# Patient Record
Sex: Female | Born: 1985 | Race: Black or African American | Hispanic: No | Marital: Single | Smoking: Current every day smoker
Health system: Southern US, Community
[De-identification: ages and names within clinical notes are randomized; demographics above are authoritative.]

## PROBLEM LIST (undated history)

## (undated) DIAGNOSIS — J45909 Unspecified asthma, uncomplicated: Secondary | ICD-10-CM

---

## 2013-04-10 ENCOUNTER — Emergency Department: Payer: Self-pay | Admitting: Emergency Medicine

## 2013-08-13 ENCOUNTER — Emergency Department: Payer: Self-pay | Admitting: Emergency Medicine

## 2013-11-15 ENCOUNTER — Emergency Department: Payer: Self-pay | Admitting: Emergency Medicine

## 2013-11-17 ENCOUNTER — Emergency Department: Payer: Self-pay | Admitting: Emergency Medicine

## 2014-01-04 ENCOUNTER — Emergency Department: Payer: Self-pay | Admitting: Emergency Medicine

## 2014-01-06 LAB — BETA STREP CULTURE(ARMC)

## 2014-09-15 ENCOUNTER — Encounter: Payer: Self-pay | Admitting: *Deleted

## 2014-09-15 ENCOUNTER — Emergency Department
Admission: EM | Admit: 2014-09-15 | Discharge: 2014-09-15 | Disposition: A | Discharge status: Home / Self Care | Payer: Self-pay | Attending: Student | Admitting: Student

## 2014-09-15 DIAGNOSIS — R1084 Generalized abdominal pain: Secondary | ICD-10-CM

## 2014-09-15 DIAGNOSIS — N946 Dysmenorrhea, unspecified: Secondary | ICD-10-CM | POA: Insufficient documentation

## 2014-09-15 DIAGNOSIS — Z720 Tobacco use: Secondary | ICD-10-CM | POA: Insufficient documentation

## 2014-09-15 HISTORY — DX: Unspecified asthma, uncomplicated: J45.909

## 2014-09-15 LAB — CBC WITH DIFFERENTIAL/PLATELET
BASOS ABS: 0.1 10*3/uL (ref 0–0.1)
Basophils Relative: 1 %
Eosinophils Absolute: 0.6 10*3/uL (ref 0–0.7)
Eosinophils Relative: 6 %
HEMATOCRIT: 39 % (ref 35.0–47.0)
HEMOGLOBIN: 12.1 g/dL (ref 12.0–16.0)
Lymphocytes Relative: 27 %
Lymphs Abs: 2.9 10*3/uL (ref 1.0–3.6)
MCH: 22.7 pg — ABNORMAL LOW (ref 26.0–34.0)
MCHC: 31.1 g/dL — AB (ref 32.0–36.0)
MCV: 72.8 fL — AB (ref 80.0–100.0)
Monocytes Absolute: 0.5 10*3/uL (ref 0.2–0.9)
Monocytes Relative: 5 %
Neutro Abs: 6.7 10*3/uL — ABNORMAL HIGH (ref 1.4–6.5)
Neutrophils Relative %: 61 %
Platelets: 264 10*3/uL (ref 150–440)
RBC: 5.35 MIL/uL — ABNORMAL HIGH (ref 3.80–5.20)
RDW: 14.4 % (ref 11.5–14.5)
WBC: 10.8 10*3/uL (ref 3.6–11.0)

## 2014-09-15 LAB — URINALYSIS COMPLETE WITH MICROSCOPIC (ARMC ONLY)
Bilirubin Urine: NEGATIVE
Glucose, UA: NEGATIVE mg/dL
Leukocytes, UA: NEGATIVE
NITRITE: NEGATIVE
PH: 7 (ref 5.0–8.0)
Protein, ur: 30 mg/dL — AB
Specific Gravity, Urine: 1.026 (ref 1.005–1.030)

## 2014-09-15 LAB — COMPREHENSIVE METABOLIC PANEL
ALK PHOS: 32 U/L — AB (ref 38–126)
ALT: 16 U/L (ref 14–54)
AST: 27 U/L (ref 15–41)
Albumin: 5.1 g/dL — ABNORMAL HIGH (ref 3.5–5.0)
Anion gap: 9 (ref 5–15)
BILIRUBIN TOTAL: 1 mg/dL (ref 0.3–1.2)
BUN: 13 mg/dL (ref 6–20)
CHLORIDE: 102 mmol/L (ref 101–111)
CO2: 24 mmol/L (ref 22–32)
Calcium: 9.6 mg/dL (ref 8.9–10.3)
Creatinine, Ser: 0.61 mg/dL (ref 0.44–1.00)
GFR calc Af Amer: >60 mL/min (ref >60)
GFR calc non Af Amer: >60 mL/min (ref >60)
Glucose, Bld: 81 mg/dL (ref 65–99)
POTASSIUM: 3.6 mmol/L (ref 3.5–5.1)
Sodium: 135 mmol/L (ref 135–145)
Total Protein: 8.7 g/dL — ABNORMAL HIGH (ref 6.5–8.1)

## 2014-09-15 MED ORDER — IBUPROFEN 800 MG PO TABS
ORAL_TABLET | ORAL | Status: AC
  Filled 2014-09-15: qty 1

## 2014-09-15 MED ORDER — OXYCODONE-ACETAMINOPHEN 5-325 MG PO TABS
1.0000 | ORAL_TABLET | Freq: Once | ORAL | Status: AC
  Administered 2014-09-15: 1 via ORAL

## 2014-09-15 MED ORDER — TRAMADOL HCL 50 MG PO TABS
50.0000 mg | ORAL_TABLET | Freq: Four times a day (QID) | ORAL | Status: DC | PRN
Start: 2014-09-15 — End: 2015-03-27

## 2014-09-15 MED ORDER — OXYCODONE-ACETAMINOPHEN 5-325 MG PO TABS
ORAL_TABLET | ORAL | Status: AC
  Filled 2014-09-15: qty 1

## 2014-09-15 MED ORDER — IBUPROFEN 800 MG PO TABS
800.0000 mg | ORAL_TABLET | Freq: Once | ORAL | Status: AC
  Administered 2014-09-15: 800 mg via ORAL

## 2014-09-15 MED ORDER — IBUPROFEN 800 MG PO TABS: 800.0000 mg | ORAL_TABLET | Freq: Three times a day (TID) | ORAL | Status: DC | PRN

## 2014-09-15 NOTE — ED Notes (Signed)
Patient with complaint of right lower abd and lower back pain that started today around 15:00.

## 2014-09-15 NOTE — Discharge Instructions (Signed)

## 2014-09-15 NOTE — ED Notes (Signed)
Pt to triage via wheelchair.  Menses began today and now pt is having cramps.  States aleve isn't helping.  Pt has low back pain

## 2014-09-15 NOTE — ED Provider Notes (Signed)
[facility] Emergency Department Provider Note ____________________________________________  Time seen: ----------------------------------------- 8:29 PM on 09/15/2014 -----------------------------------------    I have reviewed the triage vital signs and the nursing notes.   HISTORY  Chief Complaint Abdominal Cramping    HPI Tina Levy is a 29 y.o. female who presents with one day of abd pain with her menses, but "it is a lot worse than usual and not relieved with aleve".  No f/c, n/v.  She says her pain is all over  But worse on the right side.  No sore throat, cough.  She has mild head congestion and sinus pressure.  No dysuria or urinary frequency.  She is sexually active and denies other vaginal discharge other than menses.  Past Medical History  Diagnosis Date  . Asthma     There are no active problems to display for this patient.   History reviewed. No pertinent past surgical history.  Current Outpatient Rx  Name  Route  Sig  Dispense  Refill  . traMADol (ULTRAM) 50 MG tablet   Oral   Take 1 tablet (50 mg total) by mouth every 6 (six) hours as needed.   8 tablet   0     Allergies Peanuts  No family history on file.  Social History History  Substance Use Topics  . Smoking status: Current Every Day Smoker  . Smokeless tobacco: Not on file  . Alcohol Use: No    Review of Systems  Constitutional: Negative for fever. Eyes: Negative for visual changes. ENT: Negative for sore throat. Cardiovascular: Negative for chest pain. Respiratory: Negative for shortness of breath. Gastrointestinal: Negative for abdominal pain, vomiting and diarrhea. Genitourinary: Negative for dysuria. Musculoskeletal: Negative for back pain. Skin: Negative for rash. Neurological: Negative for headaches, focal weakness or numbness.   10-point ROS otherwise negative.  ____________________________________________   PHYSICAL EXAM:  VITAL  SIGNS: ED Triage Vitals  Enc Vitals Group     BP 09/15/14 1933 112/74 mmHg     Pulse Rate 09/15/14 1933 80     Resp --      Temp 09/15/14 1933 98.3 F (36.8 C)     Temp Source 09/15/14 1933 Oral     SpO2 09/15/14 1933 99 %     Weight 09/15/14 1933 95 lb (43.092 kg)     Height 09/15/14 1933 5\' 2"  (1.575 m)     Head Cir --      Peak Flow --      Pain Score 09/15/14 1935 10     Pain Loc --      Pain Edu? --      Excl. in GC? --     Constitutional: Alert and oriented. Well appearing and in no distress. Eyes: Conjunctivae are normal. PERRL. Normal extraocular movements. Cardiovascular: Normal rate, regular rhythm. Normal and symmetric distal pulses are present in all extremities. No murmurs, rubs, or gallops. Respiratory: Normal respiratory effort without tachypnea nor retractions. Breath sounds are clear and equal bilaterally. No wheezes/rales/rhonchi. Gastrointestinal: Soft and mild RUQ/RLQ/Epigastric tenderness w/o guarding or rebound.  Positive BS.Marland Kitchen. No distention. No abdominal bruits.  Genitourinary:  Musculoskeletal: Nontender with normal range of motion in all extremities. No joint effusions.  No lower extremity tenderness nor edema. Neurologic:  Normal speech and language. No gross focal neurologic deficits are appreciated. Speech is normal. No gait instability. Skin:  Skin is warm, dry and intact. No rash noted. Psychiatric: Mood and affect are normal. Speech and behavior are normal. Patient exhibits appropriate  insight and judgment.  ____________________________________________    LABS (pertinent positives/negatives)  I have reviewed all lab results which are normal or stable.     EKG    ____________________________________________    RADIOLOGY    ____________________________________________   PROCEDURES  Procedure(s) performed: None  Critical Care performed: No  ____________________________________________   INITIAL IMPRESSION / ASSESSMENT AND PLAN  / ED COURSE  Dysmenorrhea  Generalized abdominal pain  Labs and urine are WNL.  She was instructed to return to the ER for worsening pain, developing of nausea/vomiting, or fever/chills.   Pertinent labs & imaging results that were available during my care of the patient were reviewed by me and considered in my medical decision making (see chart for details).  ____________________________________________   FINAL CLINICAL IMPRESSION(S) / ED DIAGNOSES  Final diagnoses:  Generalized abdominal pain  Dysmenorrhea      Ignacia BayleyRobert Bora Bost, PA-C 09/15/14 2249  Gayla DossEryka A Gayle, MD 09/16/14 [phone]

## 2014-10-10 ENCOUNTER — Emergency Department
Admission: EM | Admit: 2014-10-10 | Discharge: 2014-10-10 | Disposition: A | Discharge status: Home / Self Care | Payer: Self-pay | Attending: Emergency Medicine | Admitting: Emergency Medicine

## 2014-10-10 ENCOUNTER — Encounter: Payer: Self-pay | Admitting: Emergency Medicine

## 2014-10-10 DIAGNOSIS — L0501 Pilonidal cyst with abscess: Secondary | ICD-10-CM | POA: Insufficient documentation

## 2014-10-10 DIAGNOSIS — L0591 Pilonidal cyst without abscess: Secondary | ICD-10-CM

## 2014-10-10 DIAGNOSIS — Z720 Tobacco use: Secondary | ICD-10-CM | POA: Insufficient documentation

## 2014-10-10 MED ORDER — SULFAMETHOXAZOLE-TRIMETHOPRIM 800-160 MG PO TABS
1.0000 | ORAL_TABLET | Freq: Two times a day (BID) | ORAL | Status: DC
Start: 2014-10-10 — End: 2017-02-25

## 2014-10-10 MED ORDER — HYDROCODONE-ACETAMINOPHEN 5-325 MG PO TABS: 1.0000 | ORAL_TABLET | ORAL | Status: DC | PRN

## 2014-10-10 MED ORDER — NAPROXEN 500 MG PO TABS: 500.0000 mg | ORAL_TABLET | Freq: Two times a day (BID) | ORAL | Status: DC

## 2014-10-10 NOTE — ED Notes (Signed)
Pt here with abscess to right butt cheek.  Pt states that she has had the area for 1 day. Pt denies any drainage, pt has hx of the same. Pt in NAD at this time and was ambulatory to treatment room.

## 2014-10-10 NOTE — Discharge Instructions (Signed)
Pilonidal Cyst A pilonidal cyst occurs when hairs get trapped (ingrown) beneath the skin in the crease between the buttocks over your sacrum (the bone under that crease). Pilonidal cysts are most common in young men with a lot of body hair. When the cyst is ruptured (breaks) or leaking, fluid from the cyst may cause burning and itching. If the cyst becomes infected, it causes a painful swelling filled with pus (abscess). The pus and trapped hairs need to be removed (often by lancing) so that the infection can heal. However, recurrence is common and an operation may be needed to remove the cyst. HOME CARE INSTRUCTIONS   If the cyst was NOT INFECTED:  Keep the area clean and dry. Bathe or shower daily. Wash the area well with a germ-killing soap. Warm tub baths may help prevent infection and help with drainage. Dry the area well with a towel.  Avoid tight clothing to keep area as moisture free as possible.  Keep area between buttocks as free of hair as possible. A depilatory may be used.  If the cyst WAS INFECTED and needed to be drained:  Your caregiver packed the wound with gauze to keep the wound open. This allows the wound to heal from the inside outwards and continue draining.  Return for a wound check in 1 day or as suggested.  If you take tub baths or showers, repack the wound with gauze following them. Sponge baths (at the sink) are a good alternative.  If an antibiotic was ordered to fight the infection, take as directed.  Only take over-the-counter or prescription medicines for pain, discomfort, or fever as directed by your caregiver.  After the drain is removed, use sitz baths for 20 minutes 4 times per day. Clean the wound gently with mild unscented soap, pat dry, and then apply a dry dressing. SEEK MEDICAL CARE IF:   You have increased pain, swelling, redness, drainage, or bleeding from the area.  You have a fever.  You have muscles aches, dizziness, or a general ill  feeling. Document Released: 04/26/2000 Document Revised: 07/22/2011 Document Reviewed: 06/24/2008 Exit[employer] Patient Information 2015 [employer]. This information is not intended to replace advice given to you by your health care provider. Make sure you discuss any questions you have with your health care provider.  

## 2014-10-10 NOTE — ED Provider Notes (Signed)
[facility] Emergency Department Provider Note  ____________________________________________  Time seen: Approximately 9:13 PM  I have reviewed the triage vital signs and the nursing notes.   HISTORY  Chief Complaint Abscess    HPI Tina Levy is a 29 y.o. female who presents to the emergency department for an abscess. Symptoms started today. She has had a pilonidal cyst drained approximately one year ago. The area became tender today. She denies fever, nausea, or vomiting.   Past Medical History  Diagnosis Date  . Asthma     There are no active problems to display for this patient.   History reviewed. No pertinent past surgical history.  Current Outpatient Rx  Name  Route  Sig  Dispense  Refill  . HYDROcodone-acetaminophen (NORCO/VICODIN) 5-325 MG per tablet   Oral   Take 1 tablet by mouth every 4 (four) hours as needed for moderate pain.   9 tablet   0   . ibuprofen (ADVIL,MOTRIN) 800 MG tablet   Oral   Take 1 tablet (800 mg total) by mouth every 8 (eight) hours as needed.   15 tablet   0   . naproxen (NAPROSYN) 500 MG tablet   Oral   Take 1 tablet (500 mg total) by mouth 2 (two) times daily with a meal.   60 tablet   2   . sulfamethoxazole-trimethoprim (BACTRIM DS,SEPTRA DS) 800-160 MG per tablet   Oral   Take 1 tablet by mouth 2 (two) times daily.   20 tablet   0   . traMADol (ULTRAM) 50 MG tablet   Oral   Take 1 tablet (50 mg total) by mouth every 6 (six) hours as needed.   8 tablet   0     Allergies Peanuts  No family history on file.  Social History History  Substance Use Topics  . Smoking status: Current Every Day Smoker    Types: Cigarettes  . Smokeless tobacco: Not on file  . Alcohol Use: No    Review of Systems Constitutional: No fever/chills Eyes: No visual changes. ENT: No sore throat. Cardiovascular: Denies chest pain. Respiratory: Denies shortness of breath. Gastrointestinal: No abdominal  pain.  No nausea, no vomiting.. Genitourinary: Negative for dysuria. Musculoskeletal: Negative for back pain. Skin: Negative for rash. Neurological: Negative for headaches, focal weakness or numbness.  10-point ROS otherwise negative.  ____________________________________________   PHYSICAL EXAM:  VITAL SIGNS: ED Triage Vitals  Enc Vitals Group     BP 10/10/14 1937 108/65 mmHg     Pulse Rate 10/10/14 1937 98     Resp 10/10/14 1937 16     Temp 10/10/14 1937 97.7 F (36.5 C)     Temp Source 10/10/14 1937 Oral     SpO2 10/10/14 1937 98 %     Weight 10/10/14 1937 105 lb (47.628 kg)     Height 10/10/14 1937  (1.6 m)     Head Cir --      Peak Flow --      Pain Score 10/10/14 1937 10     Pain Loc --      Pain Edu? --      Excl. in GC? --     Constitutional: Alert and oriented. Well appearing and in no acute distress. Eyes: Conjunctivae are normal. PERRL. EOMI. Head: Atraumatic. Nose: No congestion/rhinnorhea. Mouth/Throat: Mucous membranes are moist.   Neck: No stridor.   Cardiovascular:   Good peripheral circulation. Respiratory: Normal respiratory effort.   Gastrointestinal: Soft and nontender.  No distention. Musculoskeletal: No lower extremity tenderness nor edema.  No joint effusions. Neurologic:  Normal speech and language. No gross focal neurologic deficits are appreciated. Speech is normal. No gait instability. Skin:  Skin is warm, dry and intact. No rash noted. Tenderness present on right side of the coccyx. Area is not erythematous and is not draining. Psychiatric: Mood and affect are normal. Speech and behavior are normal.  ____________________________________________   LABS (all labs ordered are listed, but only abnormal results are displayed)  Labs Reviewed - No data to  display ____________________________________________  EKG   ____________________________________________  RADIOLOGY   ____________________________________________   PROCEDURES  Procedure(s) performed: None  Critical Care performed: No  ____________________________________________   INITIAL IMPRESSION / ASSESSMENT AND PLAN / ED COURSE  Pertinent labs & imaging results that were available during my care of the patient were reviewed by me and considered in my medical decision making (see chart for details).  Patient was advised to follow-up with a surgeon. The cyst was not opened and drained today because there is no indication of infection other than tenderness in the area. She will be prescribed Bactrim and pain medication. She was advised to return to the emergency department for symptoms that change or worsen if she is unable schedule an appointment with a surgeon. ____________________________________________   FINAL CLINICAL IMPRESSION(S) / ED DIAGNOSES  Final diagnoses:  Pilonidal cyst      Chinita PesterCari B Tuwanna Krausz, FNP 10/10/14 2119  Minna AntisKevin Paduchowski, MD 10/10/14 2329

## 2014-10-13 ENCOUNTER — Telehealth: Payer: Self-pay | Admitting: Emergency Medicine

## 2014-10-13 NOTE — ED Notes (Signed)
Pt called due to lost her instructions with surgeon name on it.  i gave pt dr Juliann Pulselundquist number.

## 2014-10-18 ENCOUNTER — Ambulatory Visit: Payer: Self-pay | Admitting: Surgery

## 2015-03-27 ENCOUNTER — Emergency Department
Admission: EM | Admit: 2015-03-27 | Discharge: 2015-03-27 | Disposition: A | Discharge status: Home / Self Care | Payer: BLUE CROSS/BLUE SHIELD | Attending: Emergency Medicine | Admitting: Emergency Medicine

## 2015-03-27 ENCOUNTER — Encounter: Payer: Self-pay | Admitting: Urgent Care

## 2015-03-27 DIAGNOSIS — Z792 Long term (current) use of antibiotics: Secondary | ICD-10-CM | POA: Diagnosis not present

## 2015-03-27 DIAGNOSIS — F17210 Nicotine dependence, cigarettes, uncomplicated: Secondary | ICD-10-CM | POA: Diagnosis not present

## 2015-03-27 DIAGNOSIS — M779 Enthesopathy, unspecified: Secondary | ICD-10-CM | POA: Insufficient documentation

## 2015-03-27 DIAGNOSIS — M79601 Pain in right arm: Secondary | ICD-10-CM | POA: Diagnosis present

## 2015-03-27 MED ORDER — MELOXICAM 7.5 MG PO TABS: 7.5000 mg | ORAL_TABLET | Freq: Every day | ORAL | Status: AC

## 2015-03-27 MED ORDER — TRAMADOL HCL 50 MG PO TABS: 50.0000 mg | ORAL_TABLET | Freq: Four times a day (QID) | ORAL | Status: DC | PRN

## 2015-03-27 NOTE — ED Notes (Signed)
Patient presents with pain that radiates from her RIGHT hand to her shoulder and neck that started yesterday. Denies injury. Able to move extremity with minimal difficulty.

## 2015-03-27 NOTE — Discharge Instructions (Signed)
Tendinitis and Tenosynovitis  °Tendinitis is inflammation of the tendon. Tenosynovitis is inflammation of the lining around the tendon (tendon sheath). These painful conditions often occur at once. Tendons attach muscle to bone. To move a limb, force from the muscle moves through the tendon, to the bone. These conditions often cause increased pain when moving. Tendinitis may be caused by a small or partial tear in the tendon.  °SYMPTOMS  °· Pain, tenderness, redness, bruising, or swelling at the injury. °· Loss of normal joint movement. °· Pain that gets worse with use of the muscle and joint attached to the tendon. °· Weakness in the tendon, caused by calcium build up that may occur with tendinitis. °· Commonly affected tendons: °¨ Achilles tendon (calf of leg). °¨ Rotator cuff (shoulder joint). °¨ Patellar tendon (kneecap to shin). °¨ Peroneal tendon (ankle). °¨ Posterior tibial tendon (inner ankle). °¨ Biceps tendon (in front of shoulder). °CAUSES  °· Sudden strain on a flexed muscle, muscle overuse, sudden increase or change in activity, vigorous activity. °· Result of a direct hit (less common). °· Poor muscle action (biomechanics). °RISK INCREASES WITH: °· Injury (trauma). °· Too much exercise. °· Sudden change in athletic activity. °· Incorrect exercise form or technique. °· Poor strength and flexibility. °· Not warming-up properly before activity. °· Returning to activity before healing is complete. °PREVENTION  °· Warm-up and stretch properly before activity. °· Maintain physical fitness: °¨ Joint flexibility. °¨ Muscle strength and endurance. °¨ Fitness that increases heart rate. °· Learn and use proper exercise techniques. °· Use rehabilitation exercises to strengthen weak muscles and tendons. °· Ice the tendon after activity, to reduce recurring inflammation. °· Wear proper fitting protective equipment for specific tendons, when indicated. °PROGNOSIS  °When treated properly, can be cured in 6 to 8 weeks.  Recovery may take longer, depending on degree of injury.  °RELATED COMPLICATIONS  °· Re-injury or recurring symptoms. °· Permanent weakness or joint stiffness, if injury is severe and recovery is not completed. °· Delayed healing, if sports are started before healing is complete. °· Tearing apart (rupture) of the inflamed tendon. Tendinitis means the tendon is injured and must recover. °TREATMENT  °Treatment first involves ice, medicine, and rest from aggravating activities. This reduces pain and inflammation. Modifying your activity may be considered to prevent recurring injury. A brace, elastic bandage wrap, splint, cast, or sling may be prescribed to protect the joint for a short period. After that period, strengthening and stretching exercise may help to regain strength and full range of motion. If the condition persists, despite non-surgical treatment, surgery may be recommended to remove the inflamed tendon lining. Corticosteroid injections may be given to reduce inflammation. However, these injections may weaken the tendon and increase your risk for tendon rupture. °MEDICATION  °· If pain medicine is needed, nonsteroidal anti-inflammatory medicines (aspirin and ibuprofen), or other minor pain relievers (acetaminophen), are often recommended. °· Do not take pain medicine for 7 days before surgery. °· Prescription pain relievers are usually prescribed only after surgery. Use only as directed and only as much as you need. °· Ointments applied to the skin may be helpful. °· Corticosteroid injections may be given to reduce inflammation. However, this may increase your risk of a tendon rupture. °HEAT AND COLD °· Cold treatment (icing) relieves pain and reduces inflammation. Cold treatment should be applied for 10 to 15 minutes every 2 to 3 hours, and immediately after activity that aggravates your symptoms. Use ice packs or an ice massage. °· Heat   treatment may be used before performing stretching and strengthening  activities prescribed by your caregiver, physical therapist, or athletic trainer. Use a heat pack or a warm water soak. °SEEK MEDICAL CARE IF:  °· Symptoms get worse or do not improve, despite treatment. °· Pain becomes too much to tolerate. °· You develop numbness or tingling. °· Toes become cold, or toenails become blue, gray, or dark colored. °· New, unexplained symptoms develop. (Drugs used in treatment may produce side effects.) °  °This information is not intended to replace advice given to you by your health care provider. Make sure you discuss any questions you have with your health care provider. °  °Document Released: 04/29/2005 Document Revised: 07/22/2011 Document Reviewed: 08/11/2008 °Elsevier Interactive Patient Education ©2016 [employer] ° °

## 2015-03-27 NOTE — ED Provider Notes (Signed)
[facility] Emergency Department Provider Note ____________________________________________  Time seen: Approximately 11:13 PM  I have reviewed the triage vital signs and the nursing notes.   HISTORY  Chief Complaint Arm Pain   HPI Tina Levy is a 29 y.o. female who presents to the emergency department for evaluation of right arm pain that started while at work today. 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 She denies previous similar symptoms. She denies injury. She has taken ibuprofen without relief.    Past Medical History  Diagnosis Date  . Asthma     There are no active problems to display for this patient.   History reviewed. No pertinent past surgical history.  Current Outpatient Rx  Name  Route  Sig  Dispense  Refill  . HYDROcodone-acetaminophen (NORCO/VICODIN) 5-325 MG per tablet   Oral   Take 1 tablet by mouth every 4 (four) hours as needed for moderate pain.   9 tablet   0   . ibuprofen (ADVIL,MOTRIN) 800 MG tablet   Oral   Take 1 tablet (800 mg total) by mouth every 8 (eight) hours as needed.   15 tablet   0   . meloxicam (MOBIC) 7.5 MG tablet   Oral   Take 1 tablet (7.5 mg total) by mouth daily.   30 tablet   0   . sulfamethoxazole-trimethoprim (BACTRIM DS,SEPTRA DS) 800-160 MG per tablet   Oral   Take 1 tablet by mouth 2 (two) times daily.   20 tablet   0   . traMADol (ULTRAM) 50 MG tablet   Oral   Take 1 tablet (50 mg total) by mouth every 6 (six) hours as needed.   9 tablet   0     Allergies Peanuts  No family history on file.  Social History Social History  Substance Use Topics  . Smoking status: Current Every Day Smoker    Types: Cigarettes  . Smokeless tobacco: None  . Alcohol Use: Yes    Review of Systems Constitutional: No recent illness. Eyes: No visual changes. ENT: No sore throat. Cardiovascular: Denies chest pain or palpitations. Respiratory: Denies  shortness of breath. Gastrointestinal: No abdominal pain.  Genitourinary: Negative for dysuria. Musculoskeletal: Pain in right arm with radiation into anterior shoulder.  Skin: Negative for rash. Neurological: Negative for headaches, focal weakness or numbness. 10-point ROS otherwise negative.  ____________________________________________   PHYSICAL EXAM:  VITAL SIGNS: ED Triage Vitals  Enc Vitals Group     BP 03/27/15 2231 119/64 mmHg     Pulse Rate 03/27/15 2231 61     Resp 03/27/15 2231 16     Temp 03/27/15 2231 98.1 F (36.7 C)     Temp Source 03/27/15 2231 Oral     SpO2 03/27/15 2231 100 %     Weight 03/27/15 2231 96 lb (43.545 kg)     Height 03/27/15 2231  (1.575 m)     Head Cir --      Peak Flow --      Pain Score 03/27/15 2231 10     Pain Loc --      Pain Edu? --      Excl. in GC? --     Constitutional: Alert and oriented. Well appearing and in no acute distress. Eyes: Conjunctivae are normal. EOMI. Head: Atraumatic. Nose: No congestion/rhinnorhea. Neck: No stridor.  Respiratory: Normal respiratory effort.   Musculoskeletal: Limited extension and flexion of right upper extremity at the  elbow due to pain.  Neurologic:  Normal speech and language. No gross focal neurologic deficits are appreciated. Speech is normal. No gait instability. Skin:  Skin is warm, dry and intact. Atraumatic. Psychiatric: Mood and affect are normal. Speech and behavior are normal.  ____________________________________________   LABS (all labs ordered are listed, but only abnormal results are displayed)  Labs Reviewed - No data to display ____________________________________________  RADIOLOGY  Not indicated ____________________________________________   PROCEDURES  Procedure(s) performed:   Sling applied to RUE by ER tech.   ____________________________________________   INITIAL IMPRESSION / ASSESSMENT AND PLAN / ED COURSE  Pertinent labs & imaging results that  were available during my care of the patient were reviewed by me and considered in my medical decision making (see chart for details).  Patient was instructed to follow up with orthopedics for symptoms that are not improving over the next week. She was advised to return to the emergency department for symptoms that change or worsen if unable schedule an appointment. She will take meloxicam for pain. She was advised to stop ibuprofen or Naprosyn. ____________________________________________   FINAL CLINICAL IMPRESSION(S) / ED DIAGNOSES  Final diagnoses:  Tendonitis       Chinita PesterCari B Dru Primeau, FNP 03/28/15 0001  Jennye MoccasinBrian S Quigley, MD 03/28/15 [phone]

## 2015-05-27 ENCOUNTER — Emergency Department
Admission: EM | Admit: 2015-05-27 | Discharge: 2015-05-27 | Disposition: A | Discharge status: Home / Self Care | Payer: BLUE CROSS/BLUE SHIELD | Attending: Emergency Medicine | Admitting: Emergency Medicine

## 2015-05-27 ENCOUNTER — Emergency Department: Payer: BLUE CROSS/BLUE SHIELD

## 2015-05-27 DIAGNOSIS — F17210 Nicotine dependence, cigarettes, uncomplicated: Secondary | ICD-10-CM | POA: Insufficient documentation

## 2015-05-27 DIAGNOSIS — M25511 Pain in right shoulder: Secondary | ICD-10-CM | POA: Insufficient documentation

## 2015-05-27 DIAGNOSIS — Z79899 Other long term (current) drug therapy: Secondary | ICD-10-CM | POA: Diagnosis not present

## 2015-05-27 DIAGNOSIS — Z791 Long term (current) use of non-steroidal anti-inflammatories (NSAID): Secondary | ICD-10-CM | POA: Diagnosis not present

## 2015-05-27 MED ORDER — TRAMADOL HCL 50 MG PO TABS: 50.0000 mg | ORAL_TABLET | Freq: Four times a day (QID) | ORAL | Status: DC | PRN

## 2015-05-27 MED ORDER — IBUPROFEN 600 MG PO TABS
600.0000 mg | ORAL_TABLET | Freq: Once | ORAL | Status: AC
  Administered 2015-05-27: 600 mg via ORAL
  Filled 2015-05-27: qty 1

## 2015-05-27 NOTE — ED Notes (Signed)
Pt reports about 2 months ago she developed pain to her right shoulder and came to the ER. She was told pain was from repeatitive use of the arm. Pt was kept out of work 2 days and then she had to have a note stating ok to return to work so she went to her PCP and her PCP sent her to physical therapy. Pt still going to physical therapy but is back working. This week she is back working her full 8 hour shift.  Tonight while at work pt reports she noticed her arm felt like it was swelling. Pt states the pain is a little worse tonight. Pt denies injury.

## 2015-05-27 NOTE — ED Provider Notes (Signed)
[facility] Emergency Department Provider Note  ____________________________________________   I have reviewed the triage vital signs and the nursing notes.   HISTORY  Chief Complaint Shoulder Pain    HPI Marta Lamasatashia Market is a 30 y.o. female who presents today complaining of shoulder pain. She has had shoulder pain for 2 not months. She only recently went back to full-time work because of her shoulder pain and now her shoulder pain is back and worse again. It never completely went away. She has not had an x-ray. She has been sent to physical therapy by her primary care doctor. She states that there is no fever, no recollected trauma. She has not seen an orthopedic surgeon for this. She would like a note for reduced labor at her job.Patient does do multiple repetitive motions in her job. She states it hurts to lift her hand over her shoulder. No numbness no weakness no neck pain  Past Medical History  Diagnosis Date  . Asthma     There are no active problems to display for this patient.   No past surgical history on file.  Current Outpatient Rx  Name  Route  Sig  Dispense  Refill  . HYDROcodone-acetaminophen (NORCO/VICODIN) 5-325 MG per tablet   Oral   Take 1 tablet by mouth every 4 (four) hours as needed for moderate pain.   9 tablet   0   . ibuprofen (ADVIL,MOTRIN) 800 MG tablet   Oral   Take 1 tablet (800 mg total) by mouth every 8 (eight) hours as needed.   15 tablet   0   . meloxicam (MOBIC) 7.5 MG tablet   Oral   Take 1 tablet (7.5 mg total) by mouth daily.   30 tablet   0   . sulfamethoxazole-trimethoprim (BACTRIM DS,SEPTRA DS) 800-160 MG per tablet   Oral   Take 1 tablet by mouth 2 (two) times daily.   20 tablet   0   . traMADol (ULTRAM) 50 MG tablet   Oral   Take 1 tablet (50 mg total) by mouth every 6 (six) hours as needed.   9 tablet   0     Allergies Peanuts  No family history on file.  Social History Social History   Substance Use Topics  . Smoking status: Current Every Day Smoker    Types: Cigarettes  . Smokeless tobacco: Not on file  . Alcohol Use: Yes    Review of Systems Constitutional: No fever/chills Eyes: No visual changes. ENT: No sore throat. No stiff neck no neck pain Cardiovascular: Denies chest pain. Respiratory: Denies shortness of breath. Gastrointestinal:   no vomiting.  No diarrhea.  No constipation. Genitourinary: Negative for dysuria. Musculoskeletal: Negative lower extremity swelling Skin: Negative for rash. Neurological: Negative for headaches, focal weakness or numbness. 10-point ROS otherwise negative.  ____________________________________________   PHYSICAL EXAM:  VITAL SIGNS: ED Triage Vitals  Enc Vitals Group     BP 05/27/15 0412 137/91 mmHg     Pulse Rate 05/27/15 0412 85     Resp 05/27/15 0412 16     Temp 05/27/15 0412 97.8 F (36.6 C)     Temp Source 05/27/15 0412 Oral     SpO2 05/27/15 0412 100 %     Weight 05/27/15 0412 97 lb (43.999 kg)     Height 05/27/15 0412 5\' 2"  (1.575 m)     Head Cir --      Peak Flow --      Pain Score 05/27/15  0421 10     Pain Loc --      Pain Edu? --      Excl. in GC? --     Constitutional: Alert and oriented. Well appearing and in no acute distress.  Neck: No stridor.   Nontender with no meningismus Cardiovascular: Normal rate, regular rhythm. Grossly normal heart sounds.  Good peripheral circulation. Respiratory: Normal respiratory effort.  No retractions. Lungs CTAB. Abdominal: Soft and nontender. No distention. No guarding no rebound Back:  There is no focal tenderness or step off there is no midline tenderness there are no lesions noted. there is no CVA tenderness Musculoskeletal: No lower extremity tenderness. No joint effusions, no DVT signs strong distal pulses no edema, right shoulder is mildly tender to touch, she has noticed a palpation but no erythema is not hot to touch does not red. She has pain to range  of motion above the 90 mark. Strong distal pulses, compartments are soft sensation and strength are intact Neurologic:  Normal speech and language. No gross focal neurologic deficits are appreciated.  Skin:  Skin is warm, dry and intact. No rash noted. Psychiatric: Mood and affect are normal. Speech and behavior are normal.  ____________________________________________   LABS (all labs ordered are listed, but only abnormal results are displayed)  Labs Reviewed - No data to display ____________________________________________  EKG  I personally interpreted any EKGs ordered by me or triage  ____________________________________________  RADIOLOGY  I reviewed any imaging ordered by me or triage that were performed during my shift ____________________________________________   PROCEDURES  Procedure(s) performed: None  Critical Care performed: None  ____________________________________________   INITIAL IMPRESSION / ASSESSMENT AND PLAN / ED COURSE  Pertinent labs & imaging results that were available during my care of the patient were reviewed by me and considered in my medical decision making (see chart for details). Patient presents today with very reproducible shoulder pain or some by repetitive motion. She does not recall having a x-ray for this. We will obtain imaging although low suspicion for any acute factor. It does not appear to be infected, patient will need to follow up with orthopedic surgery and we will refer her in that direction. Patient is requesting pain medication we'll give her a prescription for pain meds although given courtesy of pain I will not give her long prescription, just enough that she can see her doctor. She is neurovascular intact with no obvious evidence of radiculopathy. ____________________________________________   FINAL CLINICAL IMPRESSION(S) / ED DIAGNOSES  Final diagnoses:  None     Jeanmarie Plant, MD 05/27/15 [phone]

## 2015-05-27 NOTE — ED Notes (Signed)
Pt with chronic shoulder pain reports worsening pain tonight. Pt is neuro intact. Pt is reluctant to move arm due to pain

## 2015-06-03 ENCOUNTER — Emergency Department
Admission: EM | Admit: 2015-06-03 | Discharge: 2015-06-03 | Disposition: A | Discharge status: Home / Self Care | Payer: BLUE CROSS/BLUE SHIELD | Attending: Emergency Medicine | Admitting: Emergency Medicine

## 2015-06-03 ENCOUNTER — Emergency Department: Payer: BLUE CROSS/BLUE SHIELD

## 2015-06-03 DIAGNOSIS — Z3202 Encounter for pregnancy test, result negative: Secondary | ICD-10-CM | POA: Insufficient documentation

## 2015-06-03 DIAGNOSIS — R55 Syncope and collapse: Secondary | ICD-10-CM | POA: Insufficient documentation

## 2015-06-03 DIAGNOSIS — Z791 Long term (current) use of non-steroidal anti-inflammatories (NSAID): Secondary | ICD-10-CM | POA: Insufficient documentation

## 2015-06-03 DIAGNOSIS — Z792 Long term (current) use of antibiotics: Secondary | ICD-10-CM | POA: Insufficient documentation

## 2015-06-03 DIAGNOSIS — F17210 Nicotine dependence, cigarettes, uncomplicated: Secondary | ICD-10-CM | POA: Diagnosis not present

## 2015-06-03 DIAGNOSIS — R251 Tremor, unspecified: Secondary | ICD-10-CM | POA: Insufficient documentation

## 2015-06-03 LAB — FIBRIN DERIVATIVES D-DIMER (ARMC ONLY): Fibrin derivatives D-dimer (ARMC): 322 (ref 0–499)

## 2015-06-03 LAB — BASIC METABOLIC PANEL
ANION GAP: 7 (ref 5–15)
BUN: 13 mg/dL (ref 6–20)
CHLORIDE: 109 mmol/L (ref 101–111)
CO2: 23 mmol/L (ref 22–32)
Calcium: 8.7 mg/dL — ABNORMAL LOW (ref 8.9–10.3)
Creatinine, Ser: 0.61 mg/dL (ref 0.44–1.00)
Glucose, Bld: 102 mg/dL — ABNORMAL HIGH (ref 65–99)
Potassium: 2.9 mmol/L — CL (ref 3.5–5.1)
SODIUM: 139 mmol/L (ref 135–145)

## 2015-06-03 LAB — CBC WITH DIFFERENTIAL/PLATELET
Basophils Absolute: 0 10*3/uL (ref 0–0.1)
Basophils Relative: 0 %
EOS ABS: 0.2 10*3/uL (ref 0–0.7)
Eosinophils Relative: 1 %
HEMATOCRIT: 32.5 % — AB (ref 35.0–47.0)
Hemoglobin: 10.5 g/dL — ABNORMAL LOW (ref 12.0–16.0)
Lymphocytes Relative: 11 %
Lymphs Abs: 1.5 10*3/uL (ref 1.0–3.6)
MCH: 22.9 pg — ABNORMAL LOW (ref 26.0–34.0)
MCHC: 32.4 g/dL (ref 32.0–36.0)
MCV: 70.9 fL — AB (ref 80.0–100.0)
MONOS PCT: 5 %
Monocytes Absolute: 0.8 10*3/uL (ref 0.2–0.9)
Neutro Abs: 11.8 10*3/uL — ABNORMAL HIGH (ref 1.4–6.5)
Neutrophils Relative %: 83 %
PLATELETS: 272 10*3/uL (ref 150–440)
RBC: 4.59 MIL/uL (ref 3.80–5.20)
RDW: 14.5 % (ref 11.5–14.5)
WBC: 14.3 10*3/uL — ABNORMAL HIGH (ref 3.6–11.0)

## 2015-06-03 LAB — BLOOD GAS, VENOUS
Acid-base deficit: 3.6 mmol/L — ABNORMAL HIGH (ref 0.0–2.0)
Bicarbonate: 20.4 mEq/L — ABNORMAL LOW (ref 21.0–28.0)
PATIENT TEMPERATURE: 37
pCO2, Ven: 33 mmHg — ABNORMAL LOW (ref 44.0–60.0)
pH, Ven: 7.4 (ref 7.320–7.430)
pO2, Ven: <31 mmHg (ref 30.0–45.0)

## 2015-06-03 LAB — URINALYSIS COMPLETE WITH MICROSCOPIC (ARMC ONLY)
Bilirubin Urine: NEGATIVE
Glucose, UA: NEGATIVE mg/dL
HGB URINE DIPSTICK: NEGATIVE
NITRITE: NEGATIVE
Protein, ur: NEGATIVE mg/dL
Specific Gravity, Urine: 1.002 — ABNORMAL LOW (ref 1.005–1.030)
pH: 5 (ref 5.0–8.0)

## 2015-06-03 LAB — POCT PREGNANCY, URINE: PREG TEST UR: NEGATIVE

## 2015-06-03 LAB — TSH: TSH: 2.637 u[IU]/mL (ref 0.350–4.500)

## 2015-06-03 MED ORDER — SODIUM CHLORIDE 0.9 % IV BOLUS (SEPSIS)
1000.0000 mL | Freq: Once | INTRAVENOUS | Status: AC
  Administered 2015-06-03: 1000 mL via INTRAVENOUS

## 2015-06-03 MED ORDER — POTASSIUM CHLORIDE CRYS ER 20 MEQ PO TBCR
40.0000 meq | EXTENDED_RELEASE_TABLET | Freq: Once | ORAL | Status: AC
  Administered 2015-06-03: 40 meq via ORAL
  Filled 2015-06-03: qty 2

## 2015-06-03 NOTE — ED Notes (Signed)
Pt c/o hot flashes, trembling and near syncope.  Pt reports she was told by her supervisor at work that she was having breathing difficulty/increased respirations - pt does not remember having breathing issues.  Pt denies N/V. Pt reports she received the pneumonia shot at 2pm on 1/20. Pt received 300 mL NaCl from EMS prior to arrival. Pt had temp of 100.7, trembles and lightheadedness upon EMS arrival. Pt currently trembling.

## 2015-06-03 NOTE — Discharge Instructions (Signed)
Please seek medical attention for any high fevers, chest pain, shortness of breath, change in behavior, persistent vomiting, bloody stool or any other new or concerning symptoms. ° ° °Near-Syncope °Near-syncope (commonly known as near fainting) is sudden weakness, dizziness, or feeling like you might pass out. During an episode of near-syncope, you may also develop pale skin, have tunnel vision, or feel sick to your stomach (nauseous). Near-syncope may occur when getting up after sitting or while standing for a long time. It is caused by a sudden decrease in blood flow to the brain. This decrease can result from various causes or triggers, most of which are not serious. However, because near-syncope can sometimes be a sign of something serious, a medical evaluation is required. The specific cause is often not determined. °HOME CARE INSTRUCTIONS  °Monitor your condition for any changes. The following actions may help to alleviate any discomfort you are experiencing: °· Have someone stay with you until you feel stable. °· Lie down right away and prop your feet up if you start feeling like you might faint. Breathe deeply and steadily. Wait until all the symptoms have passed. Most of these episodes last only a few minutes. You may feel tired for several hours.   °· Drink enough fluids to keep your urine clear or pale yellow.   °· If you are taking blood pressure or heart medicine, get up slowly when seated or lying down. Take several minutes to sit and then stand. This can reduce dizziness. °· Follow up with your health care provider as directed.  °SEEK IMMEDIATE MEDICAL CARE IF:  °· You have a severe headache.   °· You have unusual pain in the chest, abdomen, or back.   °· You are bleeding from the mouth or rectum, or you have black or tarry stool.   °· You have an irregular or very fast heartbeat.   °· You have repeated fainting or have seizure-like jerking during an episode.   °· You faint when sitting or lying down.    °· You have confusion.   °· You have difficulty walking.   °· You have severe weakness.   °· You have vision problems.   °MAKE SURE YOU:  °· Understand these instructions. °· Will watch your condition. °· Will get help right away if you are not doing well or get worse. °  °This information is not intended to replace advice given to you by your health care provider. Make sure you discuss any questions you have with your health care provider. °  °Document Released: 04/29/2005 Document Revised: 05/04/2013 Document Reviewed: 10/02/2012 °Elsevier Interactive Patient Education ©2016 [employer] ° °

## 2015-06-03 NOTE — ED Provider Notes (Signed)
[facility] Emergency Department Provider Note   ____________________________________________  Time seen: 20  I have reviewed the triage vital signs and the nursing notes.   HISTORY  Chief Complaint Near Syncope   History limited by: Not Limited   HPI Tina Levy is a 30 y.o. female who presents from work today after a near syncopal episode. The patient states that she had been feeling good for most of the day. She went to her doctor's office and got a pneumonia shot. She denies any fevers, chest pain or shortness of breath today. At work she had just come inside after smoking a cigarette and having a kick start energy drink. She started feeling hot. She started feeling flushed. She states that she then felt like she was going to pass out. She never did pass out. She denies any chest pain or shortness of breath during this time. She also started having some tremors. The time of my examination she states she is feeling much better.     Past Medical History  Diagnosis Date  . Asthma     There are no active problems to display for this patient.   History reviewed. No pertinent past surgical history.  Current Outpatient Rx  Name  Route  Sig  Dispense  Refill  . HYDROcodone-acetaminophen (NORCO/VICODIN) 5-325 MG per tablet   Oral   Take 1 tablet by mouth every 4 (four) hours as needed for moderate pain.   9 tablet   0   . ibuprofen (ADVIL,MOTRIN) 800 MG tablet   Oral   Take 1 tablet (800 mg total) by mouth every 8 (eight) hours as needed.   15 tablet   0   . meloxicam (MOBIC) 7.5 MG tablet   Oral   Take 1 tablet (7.5 mg total) by mouth daily.   30 tablet   0   . sulfamethoxazole-trimethoprim (BACTRIM DS,SEPTRA DS) 800-160 MG per tablet   Oral   Take 1 tablet by mouth 2 (two) times daily.   20 tablet   0   . traMADol (ULTRAM) 50 MG tablet   Oral   Take 1 tablet (50 mg total) by mouth every 6 (six) hours as needed.   6 tablet    0     Allergies Peanuts and Shellfish allergy  History reviewed. No pertinent family history.  Social History Social History  Substance Use Topics  . Smoking status: Current Every Day Smoker -- 0.50 packs/day    Types: Cigarettes  . Smokeless tobacco: None  . Alcohol Use: Yes    Review of Systems  Constitutional: Negative for fever. Cardiovascular: Negative for chest pain. Respiratory: Negative for shortness of breath. Gastrointestinal: Negative for abdominal pain, vomiting and diarrhea. Neurological: Negative for headaches, focal weakness or numbness.  10-point ROS otherwise negative.  ____________________________________________   PHYSICAL EXAM:  VITAL SIGNS: ED Triage Vitals  Enc Vitals Group     BP 06/03/15 0250 116/87 mmHg     Pulse Rate 06/03/15 0250 107     Resp 06/03/15 0250 29     Temp 06/03/15 0250 98.3 F (36.8 C)     Temp Source 06/03/15 0250 Oral     SpO2 06/03/15 0250 100 %     Weight 06/03/15 0250 91 lb (41.277 kg)     Height 06/03/15 0250  (1.575 m)     Head Cir --      Peak Flow --      Pain Score 06/03/15 0258 0  Constitutional: Alert and oriented. Well appearing and in no distress. Eyes: Conjunctivae are normal. PERRL. Normal extraocular movements. ENT   Head: Normocephalic and atraumatic.   Nose: No congestion/rhinnorhea.   Mouth/Throat: Mucous membranes are moist.   Neck: No stridor. Hematological/Lymphatic/Immunilogical: No cervical lymphadenopathy. Cardiovascular: Normal rate, regular rhythm.  No murmurs, rubs, or gallops. Respiratory: Normal respiratory effort without tachypnea nor retractions. Breath sounds are clear and equal bilaterally. No wheezes/rales/rhonchi. Gastrointestinal: Soft and nontender. No distention.  Genitourinary: Deferred Musculoskeletal: Normal range of motion in all extremities. No joint effusions.  No lower extremity tenderness nor edema. Neurologic:  Normal speech and language. No gross  focal neurologic deficits are appreciated.  Skin:  Skin is warm, dry and intact. No rash noted. Psychiatric: Mood and affect are normal. Speech and behavior are normal. Patient exhibits appropriate insight and judgment.  ____________________________________________    LABS (pertinent positives/negatives)  Labs Reviewed  URINALYSIS COMPLETEWITH MICROSCOPIC ([facility] ONLY) - Abnormal; Notable for the following:    Color, Urine STRAW (*)    APPearance CLEAR (*)    Ketones, ur TRACE (*)    Specific Gravity, Urine 1.002 (*)    Leukocytes, UA 2+ (*)    Bacteria, UA RARE (*)    Squamous Epithelial / LPF 0-5 (*)    All other components within normal limits  CBC WITH DIFFERENTIAL/PLATELET - Abnormal; Notable for the following:    WBC 14.3 (*)    Hemoglobin 10.5 (*)    HCT 32.5 (*)    MCV 70.9 (*)    MCH 22.9 (*)    Neutro Abs 11.8 (*)    All other components within normal limits  BASIC METABOLIC PANEL - Abnormal; Notable for the following:    Potassium 2.9 (*)    Glucose, Bld 102 (*)    Calcium 8.7 (*)    All other components within normal limits  POCT PREGNANCY, URINE     ____________________________________________   EKG  I, Phineas Semen, attending physician, personally viewed and interpreted this EKG  EKG Time: 0311 Rate: 90 Rhythm: normal sinus rhythm Axis: normal Intervals: qtc 402 QRS: narrow ST changes: no st elevation Impression: normal ekg - interpretation limited due to artifact  I, Phineas Semen, attending physician, personally viewed and interpreted this EKG  EKG Time: 0659 Rate: 78 Rhythm: normal sinus rhythm Axis: normal Intervals: qtc 428 QRS: narrow ST changes: no st elevation Impression: normal ekg  ____________________________________________    RADIOLOGY  CXR IMPRESSION: No active disease.  I, Mry Lamia, personally viewed and evaluated these images (plain radiographs) as part of my medical decision  making. ____________________________________________   PROCEDURES  Procedure(s) performed: None  Critical Care performed: No  ____________________________________________   INITIAL IMPRESSION / ASSESSMENT AND PLAN / ED COURSE  Pertinent labs & imaging results that were available during my care of the patient were reviewed by me and considered in my medical decision making (see chart for details).  Patient presented to the emergency department today after an episode of near syncope. On exam here patient appears in no acute distress. Patient did however remain slightly tachypnea for most of her emergency department stay because of this broad differential and workup was obtained. D-dimer was negative. Patient was not acidotic. Chest x-ray without any pneumothorax or pneumonia. Patient had a mild leukocytosis but no other signs of infection. Patient was afebrile here. Unclear etiology of the tachycardia however patient stated she did feel better and for her emergency department stay. Long current outpatient follow-up with primary care  physician.  ____________________________________________   FINAL CLINICAL IMPRESSION(S) / ED DIAGNOSES  Final diagnoses:  Near syncope     Phineas Semen, MD 06/03/15 [phone]

## 2017-02-25 ENCOUNTER — Emergency Department
Admission: EM | Admit: 2017-02-25 | Discharge: 2017-02-25 | Disposition: A | Discharge status: Home / Self Care | Payer: No Typology Code available for payment source | Attending: Physician Assistant | Admitting: Physician Assistant

## 2017-02-25 ENCOUNTER — Emergency Department: Payer: No Typology Code available for payment source

## 2017-02-25 ENCOUNTER — Encounter: Payer: Self-pay | Admitting: Emergency Medicine

## 2017-02-25 DIAGNOSIS — F17210 Nicotine dependence, cigarettes, uncomplicated: Secondary | ICD-10-CM | POA: Diagnosis not present

## 2017-02-25 DIAGNOSIS — Z91010 Allergy to peanuts: Secondary | ICD-10-CM | POA: Insufficient documentation

## 2017-02-25 DIAGNOSIS — Y999 Unspecified external cause status: Secondary | ICD-10-CM | POA: Insufficient documentation

## 2017-02-25 DIAGNOSIS — S8002XA Contusion of left knee, initial encounter: Secondary | ICD-10-CM

## 2017-02-25 DIAGNOSIS — S8992XA Unspecified injury of left lower leg, initial encounter: Secondary | ICD-10-CM | POA: Diagnosis present

## 2017-02-25 DIAGNOSIS — J45909 Unspecified asthma, uncomplicated: Secondary | ICD-10-CM | POA: Diagnosis not present

## 2017-02-25 DIAGNOSIS — Y929 Unspecified place or not applicable: Secondary | ICD-10-CM | POA: Insufficient documentation

## 2017-02-25 DIAGNOSIS — Y939 Activity, unspecified: Secondary | ICD-10-CM | POA: Insufficient documentation

## 2017-02-25 MED ORDER — NAPROXEN 500 MG PO TABS: 500.0000 mg | ORAL_TABLET | Freq: Two times a day (BID) | ORAL | 0 refills | Status: DC

## 2017-02-25 NOTE — ED Notes (Signed)
X-ray at bedside
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
## 2017-02-25 NOTE — Discharge Instructions (Signed)
Ice and elevate knee as needed for discomfort. Begin taking naproxen 500 mg twice a day with food. Wear ace wrap as needed for support. Follow up with your doctor at [facility] if any continued problems.
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
## 2017-06-09 ENCOUNTER — Ambulatory Visit: Payer: Self-pay | Admitting: Physician Assistant

## 2017-10-11 ENCOUNTER — Other Ambulatory Visit: Payer: Self-pay

## 2017-10-11 ENCOUNTER — Emergency Department
Admission: EM | Admit: 2017-10-11 | Discharge: 2017-10-11 | Disposition: A | Discharge status: Home / Self Care | Payer: Self-pay | Attending: Emergency Medicine | Admitting: Emergency Medicine

## 2017-10-11 ENCOUNTER — Encounter: Payer: Self-pay | Admitting: Emergency Medicine

## 2017-10-11 DIAGNOSIS — F17210 Nicotine dependence, cigarettes, uncomplicated: Secondary | ICD-10-CM | POA: Insufficient documentation

## 2017-10-11 DIAGNOSIS — L03317 Cellulitis of buttock: Secondary | ICD-10-CM | POA: Insufficient documentation

## 2017-10-11 DIAGNOSIS — J45909 Unspecified asthma, uncomplicated: Secondary | ICD-10-CM | POA: Insufficient documentation

## 2017-10-11 DIAGNOSIS — Z91010 Allergy to peanuts: Secondary | ICD-10-CM | POA: Insufficient documentation

## 2017-10-11 DIAGNOSIS — Z79899 Other long term (current) drug therapy: Secondary | ICD-10-CM | POA: Insufficient documentation

## 2017-10-11 MED ORDER — SULFAMETHOXAZOLE-TRIMETHOPRIM 800-160 MG PO TABS: 1.0000 | ORAL_TABLET | Freq: Two times a day (BID) | ORAL | 0 refills | Status: DC

## 2017-10-11 NOTE — ED Notes (Signed)
Pt arrived with c/o abscess on coccyx. Pt states site is Tender to touch and has been getting worse. Pt AxOx4. No N/V/D.

## 2017-10-11 NOTE — ED Provider Notes (Signed)
[facility] Emergency Department Provider Note ____________________________________________  Time seen: 0945  I have reviewed the triage vital signs and the nursing notes.  HISTORY  Chief Complaint  Abscess   HPI Tina Levy is a 32 y.o. female presents to the ER with complaint of swelling and pain at the top of her left buttocks.  She reports she notices 3 days ago.  The area is tender and warm, but she has not noticed any drainage.  She denies fever but has had a few chills.  She has not put anything on the area.  She reports a history of an abscess in this same area that had to be I & D about 3 years ago.  Past Medical History:  Diagnosis Date  . Asthma     There are no active problems to display for this patient.   History reviewed. No pertinent surgical history.  Prior to Admission medications   Medication Sig Start Date End Date Taking? Authorizing Provider  naproxen (NAPROSYN) 500 MG tablet Take 1 tablet (500 mg total) by mouth 2 (two) times daily with a meal. 02/25/17   Bridget HartshornSummers, Rhonda L, PA-C  sulfamethoxazole-trimethoprim (BACTRIM DS,SEPTRA DS) 800-160 MG tablet Take 1 tablet by mouth 2 (two) times daily. 10/11/17   Lorre MunroeBaity, Regina W, NP    Allergies Peanuts [peanut oil] and Shellfish allergy  No family history on file.  Social History Social History   Tobacco Use  . Smoking status: Current Every Day Smoker    Packs/day: 2.00    Types: Cigarettes  . Smokeless tobacco: Never Used  Substance Use Topics  . Alcohol use: Yes  . Drug use: No    Review of Systems  Constitutional: Positive for chills.  Negative for fever. Cardiovascular: Negative for chest pain. Respiratory: Negative for shortness of breath.. Skin: Positive for redness and swelling.  Negative for rash.  ____________________________________________  PHYSICAL EXAM:  VITAL SIGNS: ED Triage Vitals  Enc Vitals Group     BP 10/11/17 0804 136/87     Pulse Rate 10/11/17  0804 93     Resp 10/11/17 0804 18     Temp 10/11/17 0804 (!) 97.5 F (36.4 C)     Temp Source 10/11/17 0804 Oral     SpO2 10/11/17 0804 99 %     Weight 10/11/17 0805 105 lb (47.6 kg)     Height 10/11/17 0805 5\' 1"  (1.549 m)     Head Circumference --      Peak Flow --      Pain Score 10/11/17 0805 10     Pain Loc --      Pain Edu? --      Excl. in GC? --     Constitutional: Alert and oriented. Well appearing and in no distress. Cardiovascular: Normal rate, regular rhythm.  Respiratory: Normal respiratory effort. No wheezes/rales/rhonchi. Skin: 1/2 cm area of redness, tender to palpation.  No appreciable abscess noted.  No open areas.  Linear scar noted from previous abscess drainage.  INITIAL IMPRESSION / ASSESSMENT AND PLAN / ED COURSE  Redness and sSwelling of Left Buttock, concerning for Cellulitis:  No abscess that needs drained today Encouraged warm compresses 3 times a day or sitting in a hot bath RX provided for Septra DS 1 tab PO BIDdaily x10 days ____________________________________________  FINAL CLINICAL IMPRESSION(S) / ED DIAGNOSES  Final diagnoses:  Cellulitis of buttock      Lorre MunroeBaity, Regina W, NP 10/11/17 1010    Jeanmarie PlantMcShane, James A, MD  10/11/17 1500  

## 2017-10-11 NOTE — ED Triage Notes (Signed)
Abscess coccyx area x 3 days. Thinks started as ingrown hair.

## 2017-10-11 NOTE — Discharge Instructions (Addendum)
You have been diagnosed with cellulitis.  We have provided you a prescription for antibiotics.  Take all the antibiotics as prescribed.  You can use warm compresses 3 times a day or sit in a hot bath daily.  Monitor for fever, chills and body aches.  If symptoms persist or worsen, please return to the emergency department.

## 2017-10-16 ENCOUNTER — Encounter: Payer: Self-pay | Admitting: Emergency Medicine

## 2017-10-16 ENCOUNTER — Other Ambulatory Visit: Payer: Self-pay

## 2017-10-16 ENCOUNTER — Emergency Department
Admission: EM | Admit: 2017-10-16 | Discharge: 2017-10-16 | Disposition: A | Discharge status: Home / Self Care | Payer: Self-pay | Attending: Emergency Medicine | Admitting: Emergency Medicine

## 2017-10-16 DIAGNOSIS — F17210 Nicotine dependence, cigarettes, uncomplicated: Secondary | ICD-10-CM | POA: Insufficient documentation

## 2017-10-16 DIAGNOSIS — L0291 Cutaneous abscess, unspecified: Secondary | ICD-10-CM | POA: Insufficient documentation

## 2017-10-16 DIAGNOSIS — J45909 Unspecified asthma, uncomplicated: Secondary | ICD-10-CM | POA: Insufficient documentation

## 2017-10-16 MED ORDER — TRAMADOL HCL 50 MG PO TABS: 50.0000 mg | ORAL_TABLET | Freq: Four times a day (QID) | ORAL | 0 refills | Status: DC | PRN

## 2017-10-16 MED ORDER — LIDOCAINE HCL (PF) 1 % IJ SOLN
5.0000 mL | Freq: Once | INTRAMUSCULAR | Status: AC
  Administered 2017-10-16: 5 mL via INTRADERMAL
  Filled 2017-10-16: qty 5

## 2017-10-16 MED ORDER — CLINDAMYCIN HCL 150 MG PO CAPS: 300.0000 mg | ORAL_CAPSULE | Freq: Three times a day (TID) | ORAL | 0 refills | Status: DC

## 2017-10-16 NOTE — ED Triage Notes (Signed)
Patient ambulatory to triage with steady gait, without difficulty or distress noted; pt reports "ingrown hair" to coccyx, present x wk; seen for same but did not perform I&D

## 2017-10-16 NOTE — ED Notes (Signed)
Ingrown hair. Pt took ABX that were prescribed last week but feels area is just getting larger and the pain is worse.

## 2017-10-16 NOTE — ED Provider Notes (Signed)
[facility] Emergency Department Provider Note  ____________________________________________   First MD Initiated Contact with Patient 10/16/17 1916     (approximate)  I have reviewed the triage vital signs and the nursing notes.   HISTORY  Chief Complaint Abscess    HPI Tina Levy is a 32 y.o. female presents emergency department complaining of an abscess on the buttocks.  She was seen here approximately 1 week ago on 10/11/2017 and was given Septra DS 1 twice daily.  She was told to soak in a tub of hot water with.  She states the area has gotten worse even though she is taking the antibiotic as prescribed.  She denies any fever or chills.  She states she had the same symptoms 3 years ago and the area had to be lanced.  Past Medical History:  Diagnosis Date  . Asthma     There are no active problems to display for this patient.   History reviewed. No pertinent surgical history.  Prior to Admission medications   Medication Sig Start Date End Date Taking? Authorizing Provider  clindamycin (CLEOCIN) 150 MG capsule Take 2 capsules (300 mg total) by mouth 3 (three) times daily. 10/16/17   Fisher, Roselyn Bering, PA-C  naproxen (NAPROSYN) 500 MG tablet Take 1 tablet (500 mg total) by mouth 2 (two) times daily with a meal. 02/25/17   Tommi Rumps, PA-C  sulfamethoxazole-trimethoprim (BACTRIM DS,SEPTRA DS) 800-160 MG tablet Take 1 tablet by mouth 2 (two) times daily. 10/11/17   Lorre Munroe, NP  traMADol (ULTRAM) 50 MG tablet Take 1 tablet (50 mg total) by mouth every 6 (six) hours as needed. 10/16/17   Sherrie Mustache Roselyn Bering, PA-C    Allergies Peanuts [peanut oil] and Shellfish allergy  No family history on file.  Social History Social History   Tobacco Use  . Smoking status: Current Every Day Smoker    Packs/day: 2.00    Types: Cigarettes  . Smokeless tobacco: Never Used  Substance Use Topics  . Alcohol use: Yes  . Drug use: No    Review of  Systems  Constitutional: No fever/chills Eyes: No visual changes. ENT: No sore throat. Respiratory: Denies cough Genitourinary: Negative for dysuria. Musculoskeletal: Negative for back pain. Skin: Negative for rash.  Positive for abscess on buttock    ____________________________________________   PHYSICAL EXAM:  VITAL SIGNS: ED Triage Vitals  Enc Vitals Group     BP 10/16/17 1908 117/72     Pulse Rate 10/16/17 1908 94     Resp 10/16/17 1908 18     Temp 10/16/17 1908 98 F (36.7 C)     Temp Source 10/16/17 1908 Oral     SpO2 10/16/17 1908 100 %     Weight 10/16/17 1907 105 lb (47.6 kg)     Height 10/16/17 1907 5\' 1"  (1.549 m)     Head Circumference --      Peak Flow --      Pain Score 10/16/17 1907 10     Pain Loc --      Pain Edu? --      Excl. in GC? --     Constitutional: Alert and oriented. Well appearing and in no acute distress. Eyes: Conjunctivae are normal.  Head: Atraumatic. Nose: No congestion/rhinnorhea. Mouth/Throat: Mucous membranes are moist.   Cardiovascular: Normal rate, regular rhythm. Respiratory: Normal respiratory effort.  No retractions GU: deferred Musculoskeletal: FROM all extremities, warm and well perfused Neurologic:  Normal speech and language.  Skin:  Skin is warm, dry and intact. No rash noted.  Positive for a tender indurated swollen area on the left upper buttock.  There is no drainage at this time. Psychiatric: Mood and affect are normal. Speech and behavior are normal.  ____________________________________________   LABS (all labs ordered are listed, but only abnormal results are displayed)  Labs Reviewed - No data to display ____________________________________________   ____________________________________________  RADIOLOGY    ____________________________________________   PROCEDURES  Procedure(s) performed:   Marland KitchenMarland KitchenIncision and Drainage Date/Time: 10/16/2017 8:09 PM Performed by: Faythe Ghee, PA-C Authorized  by: Faythe Ghee, PA-C   Consent:    Consent obtained:  Verbal   Consent given by:  Patient   Risks discussed:  Incomplete drainage, pain and infection Location:    Type:  Abscess   Location:  Lower extremity   Lower extremity location:  Buttock   Buttock location:  L buttock Pre-procedure details:    Skin preparation:  Betadine Anesthesia (see MAR for exact dosages):    Anesthesia method:  Local infiltration   Local anesthetic:  Lidocaine 1% w/o epi Procedure type:    Complexity:  Simple Procedure details:    Needle aspiration: no     Incision types:  Stab incision   Incision depth:  Dermal   Scalpel blade:  11   Wound management:  Probed and deloculated and irrigated with saline   Drainage:  Purulent   Drainage amount:  Copious   Wound treatment:  Wound left open   Packing materials:  None Post-procedure details:    Patient tolerance of procedure:  Tolerated well, no immediate complications Comments:     A dressing was applied by nursing staff      ____________________________________________   INITIAL IMPRESSION / ASSESSMENT AND PLAN / ED COURSE  Pertinent labs & imaging results that were available during my care of the patient were reviewed by me and considered in my medical decision making (see chart for details).  Patient is a 32 year old female presents emergency department complaining abscess on the left buttock.  She has been on antibiotics since 10/11/2017 and states that the area has become more swollen and tender.  She states she cannot sit at work.  On physical exam the area is tender and indurated.  There is some swelling noted.  Incision and drainage was performed of the abscess.  Purulent odorous drainage was expelled.  The area is too shallow for packing.  A dressing was applied by nursing staff.  The patient was instructed to stop the Septra.  She was given a prescription for clindamycin.  She is to return to the emergency department if the area is  worsening.  She is to stand in the shower and let the water and soap run over the area to keep it clean.  She states she understands will comply with our instructions.  She was given a prescription for tramadol for pain.  She was discharged in stable condition     As part of my medical decision making, I reviewed the following data within the electronic MEDICAL RECORD NUMBER Nursing notes reviewed and incorporated, Old chart reviewed, Notes from prior ED visits and [place] Controlled Substance Database  ____________________________________________   FINAL CLINICAL IMPRESSION(S) / ED DIAGNOSES  Final diagnoses:  Abscess      NEW MEDICATIONS STARTED DURING THIS VISIT:  New Prescriptions   CLINDAMYCIN (CLEOCIN) 150 MG CAPSULE    Take 2 capsules (300 mg total) by mouth 3 (three) times daily.  TRAMADOL (ULTRAM) 50 MG TABLET    Take 1 tablet (50 mg total) by mouth every 6 (six) hours as needed.     Note:  This document was prepared using Dragon voice recognition software and may include unintentional dictation errors.    Faythe GheeFisher, Susan W, PA-C 10/16/17 2012    Minna AntisPaduchowski, Kevin, MD 10/16/17 2228

## 2017-10-16 NOTE — Discharge Instructions (Addendum)
Stand in the shower and let the soap and water drain through the incision area.  Take the new antibiotic as prescribed.  Stop the other antibiotic.  If the area is worsening please return to the emergency department.

## 2018-03-27 ENCOUNTER — Other Ambulatory Visit: Payer: Self-pay

## 2018-03-27 ENCOUNTER — Emergency Department
Admission: EM | Admit: 2018-03-27 | Discharge: 2018-03-27 | Disposition: A | Discharge status: Home / Self Care | Payer: Self-pay | Attending: Emergency Medicine | Admitting: Emergency Medicine

## 2018-03-27 ENCOUNTER — Encounter: Payer: Self-pay | Admitting: Emergency Medicine

## 2018-03-27 DIAGNOSIS — L0501 Pilonidal cyst with abscess: Secondary | ICD-10-CM | POA: Insufficient documentation

## 2018-03-27 DIAGNOSIS — J45909 Unspecified asthma, uncomplicated: Secondary | ICD-10-CM | POA: Insufficient documentation

## 2018-03-27 DIAGNOSIS — Z91010 Allergy to peanuts: Secondary | ICD-10-CM | POA: Insufficient documentation

## 2018-03-27 DIAGNOSIS — F17210 Nicotine dependence, cigarettes, uncomplicated: Secondary | ICD-10-CM | POA: Insufficient documentation

## 2018-03-27 MED ORDER — HYDROCODONE-ACETAMINOPHEN 5-325 MG PO TABS: 1.0000 | ORAL_TABLET | Freq: Four times a day (QID) | ORAL | 0 refills | Status: DC | PRN

## 2018-03-27 MED ORDER — ONDANSETRON HCL 4 MG PO TABS
4.0000 mg | ORAL_TABLET | Freq: Once | ORAL | Status: AC
  Administered 2018-03-27: 4 mg via ORAL

## 2018-03-27 MED ORDER — AMOXICILLIN-POT CLAVULANATE 875-125 MG PO TABS: 1.0000 | ORAL_TABLET | Freq: Two times a day (BID) | ORAL | 0 refills | Status: DC

## 2018-03-27 MED ORDER — ONDANSETRON 4 MG PO TBDP
ORAL_TABLET | ORAL | Status: AC
  Filled 2018-03-27: qty 1

## 2018-03-27 MED ORDER — DOCUSATE SODIUM 100 MG PO CAPS: 100.0000 mg | ORAL_CAPSULE | Freq: Two times a day (BID) | ORAL | 0 refills | Status: AC

## 2018-03-27 MED ORDER — OXYCODONE-ACETAMINOPHEN 5-325 MG PO TABS
1.0000 | ORAL_TABLET | ORAL | Status: DC | PRN
  Administered 2018-03-27: 1 via ORAL
  Filled 2018-03-27: qty 1

## 2018-03-27 MED ORDER — HYDROMORPHONE HCL 1 MG/ML IJ SOLN
2.0000 mg | Freq: Once | INTRAMUSCULAR | Status: DC
  Filled 2018-03-27: qty 2

## 2018-03-27 MED ORDER — LIDOCAINE-EPINEPHRINE 2 %-1:100000 IJ SOLN
20.0000 mL | Freq: Once | INTRAMUSCULAR | Status: AC
  Administered 2018-03-27: 20 mL via INTRADERMAL
  Filled 2018-03-27: qty 1

## 2018-03-27 MED ORDER — HYDROMORPHONE HCL 1 MG/ML IJ SOLN
2.0000 mg | Freq: Once | INTRAMUSCULAR | Status: AC
  Administered 2018-03-27: 2 mg via INTRAMUSCULAR

## 2018-03-27 NOTE — Discharge Instructions (Signed)
It is critically important that you take your antibiotics twice a day as prescribed and perform SITZ BATHS twice a day to help keep your wound clean.  Make sure you follow up with the general surgeon THIS COMING Monday for a recheck.  Return to the ED sooner for any concerns.  It was a pleasure to take care of you today, and thank you for coming to our emergency department.  If you have any questions or concerns before leaving please ask the nurse to grab me and I'm more than happy to go through your aftercare instructions again.  If you were prescribed any opioid pain medication today such as Norco, Vicodin, Percocet, morphine, hydrocodone, or oxycodone please make sure you do not drive when you are taking this medication as it can alter your ability to drive safely.  If you have any concerns once you are home that you are not improving or are in fact getting worse before you can make it to your follow-up appointment, please do not hesitate to call [phone] and come back for further evaluation.  Merrily BrittleNeil Sherrin Stahle, MD  While here in the ER today you received very powerful medicine that makes it unsafe for you to drive for the rest of the day.  Do not drive until tomorrow.

## 2018-03-27 NOTE — ED Provider Notes (Signed)
[facility] Emergency Department Provider Note  ____________________________________________   First MD Initiated Contact with Patient 03/27/18 [phone]     (approximate)  I have reviewed the triage vital signs and the nursing notes.   HISTORY  Chief Complaint Abscess    HPI Tina Levy is a 32 y.o. female who self presents to the emergency department with several days of painful swelling to her left buttock.  She says this is the fourth time this is happened.  Is been drained multiple times.  Her seen a Careers adviser before.  No fevers or chills.  Her symptoms came on gradually been slowly progressive now moderate to severe.  She has noted a small discharge in her underwear.  No trauma.  The pain is now constant nonradiating worse with movement or sitting and improves with lying on her belly.    Past Medical History:  Diagnosis Date  . Asthma     There are no active problems to display for this patient.   History reviewed. No pertinent surgical history.  Prior to Admission medications   Medication Sig Start Date End Date Taking? Authorizing Provider  amoxicillin-clavulanate (AUGMENTIN) 875-125 MG tablet Take 1 tablet by mouth 2 (two) times daily for 10 days. 03/27/18 04/06/18  Merrily Brittle, MD  clindamycin (CLEOCIN) 150 MG capsule Take 2 capsules (300 mg total) by mouth 3 (three) times daily. 10/16/17   Fisher, Roselyn Bering, PA-C  docusate sodium (COLACE) 100 MG capsule Take 1 capsule (100 mg total) by mouth 2 (two) times daily. 03/27/18 03/27/19  Merrily Brittle, MD  HYDROcodone-acetaminophen (NORCO) 5-325 MG tablet Take 1 tablet by mouth every 6 (six) hours as needed for up to 11 doses for severe pain. 03/27/18   Merrily Brittle, MD  naproxen (NAPROSYN) 500 MG tablet Take 1 tablet (500 mg total) by mouth 2 (two) times daily with a meal. 02/25/17   Bridget Hartshorn L, PA-C  sulfamethoxazole-trimethoprim (BACTRIM DS,SEPTRA DS) 800-160 MG tablet Take 1 tablet by  mouth 2 (two) times daily. 10/11/17   Lorre Munroe, NP  traMADol (ULTRAM) 50 MG tablet Take 1 tablet (50 mg total) by mouth every 6 (six) hours as needed. 10/16/17   Sherrie Mustache Roselyn Bering, PA-C    Allergies Peanuts [peanut oil] and Shellfish allergy  No family history on file.  Social History Social History   Tobacco Use  . Smoking status: Current Every Day Smoker    Packs/day: 2.00    Types: Cigarettes  . Smokeless tobacco: Never Used  Substance Use Topics  . Alcohol use: Yes  . Drug use: No    Review of Systems Constitutional: No fever/chills Eyes: No visual changes. ENT: No sore throat. Cardiovascular: Denies chest pain. Respiratory: Denies shortness of breath. Gastrointestinal: No abdominal pain.  No nausea, no vomiting.  No diarrhea.  No constipation. Genitourinary: Negative for dysuria. Musculoskeletal: Negative for back pain. Skin: Positive for abscess Neurological: Negative for headaches, focal weakness or numbness.   ____________________________________________   PHYSICAL EXAM:  VITAL SIGNS: ED Triage Vitals [03/27/18 0047]  Enc Vitals Group     BP 129/90     Pulse Rate (!) 125     Resp 20     Temp 98.1 F (36.7 C)     Temp Source Oral     SpO2 97 %     Weight 104 lb 15 oz (47.6 kg)     Height 5\' 1"  (1.549 m)     Head Circumference      Peak  Flow      Pain Score 10     Pain Loc      Pain Edu?      Excl. in GC?     Constitutional: Alert and oriented x4 appears exquisitely uncomfortable although nontoxic no diaphoresis Eyes: PERRL EOMI. Head: Atraumatic. Nose: No congestion/rhinnorhea. Mouth/Throat: No trismus Neck: No stridor.   Cardiovascular: Tachycardic rate, regular rhythm. Grossly normal heart sounds.  Good peripheral circulation. Respiratory: Normal respiratory effort.  No retractions. Lungs CTAB and moving good air Gastrointestinal: Soft nontender Rectal exam chaperoned by female nurse Deveta: Left-sided pilonidal cyst that has become an  abscess just to the left of the gluteal cleft roughly 10 cm coming to a 2 cm fluctuant head expressing a small amount of purulent material Musculoskeletal: No lower extremity edema   Neurologic:  Normal speech and language. No gross focal neurologic deficits are appreciated. Skin:  Skin is warm, dry and intact. No rash noted. Psychiatric: Mood and affect are normal. Speech and behavior are normal.    ____________________________________________   DIFFERENTIAL includes but not limited to  Pilonidal cyst, pilonidal abscess, fistula in ano, perirectal abscess ____________________________________________   LABS (all labs ordered are listed, but only abnormal results are displayed)  Labs Reviewed - No data to display   __________________________________________  EKG   ____________________________________________  RADIOLOGY   ____________________________________________   PROCEDURES  Procedure(s) performed: Yes  .Marland KitchenIncision and Drainage Date/Time: 03/27/2018 9:02 AM Performed by: Merrily Brittle, MD Authorized by: Merrily Brittle, MD   Consent:    Consent obtained:  Verbal   Consent given by:  Patient   Risks discussed:  Bleeding, incomplete drainage, pain and infection   Alternatives discussed:  Alternative treatment Location:    Type:  Abscess   Size:  Left-sided pilonidal abscess roughly 10 cm   Location:  Anogenital   Anogenital location:  Pilonidal Pre-procedure details:    Skin preparation:  Chloraprep Sedation:    Sedation type: Intramuscular Dilaudid for pain. Anesthesia (see MAR for exact dosages):    Anesthesia method:  Local infiltration   Local anesthetic:  Lidocaine 2% WITH epi Procedure type:    Complexity:  Complex Procedure details:    Needle aspiration: no     Incision types:  Single straight   Scalpel blade:  11   Wound management:  Probed and deloculated, extensive cleaning and debrided   Drainage:  Purulent   Drainage amount:  Copious    Wound treatment:  Wound left open   Packing materials:  None Post-procedure details:    Patient tolerance of procedure:  Tolerated with difficulty Comments:     Patient comes to the emergency department with pilonidal abscess roughly 10 cm.  Is coming to ahead and almost beginning to drain on its own.  After intramuscular Dilaudid for pain control I cleansed the area with chlorhexidine and used 2% lidocaine with epinephrine to numb the top then over the point of maximal fluctuance made a stab incision extending it for about 2 cm expressing a large amount of purulent material.  Necrotic tissue was debrided and all loculations were broken up.  The patient was in some discomfort throughout the procedure however she tolerated quite well    Critical Care performed: no  ____________________________________________   INITIAL IMPRESSION / ASSESSMENT AND PLAN / ED COURSE  Pertinent labs & imaging results that were available during my care of the patient were reviewed by me and considered in my medical decision making (see chart for details).   As  part of my medical decision making, I reviewed the following data within the electronic MEDICAL RECORD NUMBER History obtained from family if available, nursing notes, old chart and ekg, as well as notes from prior ED visits.  Patient comes to the emergency department with pilonidal abscess that is coming to ahead and nearly draining on its own.  Given intramuscular Dilaudid for pain control as she is not driving is subsequently drained using topical lidocaine and an 11 blade.  Copious pus was expressed and all loculations broken up.  The patient tolerated well.  Sitz baths discussed and I will refer her to general surgery along with Augmentin and Norco for infection and pain control.  Strict return precautions been given.      ____________________________________________   FINAL CLINICAL IMPRESSION(S) / ED DIAGNOSES  Final diagnoses:  Pilonidal abscess       NEW MEDICATIONS STARTED DURING THIS VISIT:  Discharge Medication List as of 03/27/2018  3:47 AM    START taking these medications   Details  amoxicillin-clavulanate (AUGMENTIN) 875-125 MG tablet Take 1 tablet by mouth 2 (two) times daily for 10 days., Starting Fri 03/27/2018, Until Mon 04/06/2018, Print    docusate sodium (COLACE) 100 MG capsule Take 1 capsule (100 mg total) by mouth 2 (two) times daily., Starting Fri 03/27/2018, Until Sat 03/27/2019, Print    HYDROcodone-acetaminophen (NORCO) 5-325 MG tablet Take 1 tablet by mouth every 6 (six) hours as needed for up to 11 doses for severe pain., Starting Fri 03/27/2018, Print         Note:  This document was prepared using Dragon voice recognition software and may include unintentional dictation errors.     Merrily Brittleifenbark, Jeren Dufrane, MD 03/27/18 [phone]

## 2018-03-27 NOTE — ED Triage Notes (Signed)
Patient to ER for abscess to sacral area. Patient reports this is 4th time having same complaint. States she has had to have area drained twice, one time it drained on it's own. Patient states this time is more painful in the past and is not draining at this time.

## 2018-03-27 NOTE — ED Notes (Signed)
Topaz pad not working. Pt verbally understood prescriptions and discharge instructions.

## 2018-04-02 ENCOUNTER — Encounter: Payer: Self-pay | Admitting: Surgery

## 2018-04-02 ENCOUNTER — Other Ambulatory Visit: Payer: Self-pay

## 2018-04-02 ENCOUNTER — Ambulatory Visit (INDEPENDENT_AMBULATORY_CARE_PROVIDER_SITE_OTHER): Payer: Self-pay | Admitting: Surgery

## 2018-04-02 VITALS — BP 119/76 | HR 71 | Temp 97.7°F | Resp 16 | Ht 61.0 in | Wt 105.4 lb

## 2018-04-02 DIAGNOSIS — L0591 Pilonidal cyst without abscess: Secondary | ICD-10-CM

## 2018-04-02 NOTE — Patient Instructions (Addendum)
If you decide to have your Pilonidal cyst removed please call our office and we can schedule this for you.    Pilonidal Cyst A pilonidal cyst is a fluid-filled sac. It forms beneath the skin near your tailbone, at the top of the crease of your buttocks. A pilonidal cyst that is not large or infected may not cause symptoms or problems. If the cyst becomes irritated or infected, it may fill with pus. This causes pain and swelling (pilonidal abscess). An infected cyst may need to be treated with medicine, drained, or removed. What are the causes? The cause of a pilonidal cyst is not known. One cause may be a hair that grows into your skin (ingrown hair). What increases the risk? Pilonidal cysts are more common in boys and men. Risk factors include:  Having lots of hair near the crease of the buttocks.  Being overweight.  Having a pilonidal dimple.  Wearing tight clothing.  Not bathing or showering frequently.  Sitting for long periods of time.  What are the signs or symptoms? Signs and symptoms of a pilonidal cyst may include:  Redness.  Pain and tenderness.  Warmth.  Swelling.  Pus.  Fever.  How is this diagnosed? Your health care provider may diagnose a pilonidal cyst based on your symptoms and a physical exam. The health care provider may do a blood test to check for infection. If your cyst is draining pus, your health care provider may take a sample of the drainage to be tested at a laboratory. How is this treated? Surgery is the usual treatment for an infected pilonidal cyst. You may also have to take medicines before surgery. The type of surgery you have depends on the size and severity of the infected cyst. The different kinds of surgery include:  Incision and drainage. This is a procedure to open and drain the cyst.  Marsupialization. In this procedure, a large cyst or abscess may be opened and kept open by stitching the edges of the skin to the cyst walls.  Cyst  removal. This procedure involves opening the skin and removing all or part of the cyst.  Follow these instructions at home:  Follow all of your surgeon's instructions carefully if you had surgery.  Take medicines only as directed by your health care provider.  If you were prescribed an antibiotic medicine, finish it all even if you start to feel better.  Keep the area around your pilonidal cyst clean and dry.  Clean the area as directed by your health care provider. Pat the area dry with a clean towel. Do not rub it as this may cause bleeding.  Remove hair from the area around the cyst as directed by your health care provider.  Do not wear tight clothing or sit in one place for long periods of time.  There are many different ways to close and cover an incision, including stitches, skin glue, and adhesive strips. Follow your health care provider's instructions on: ? Incision care. ? Bandage (dressing) changes and removal. ? Incision closure removal. Contact a health care provider if:  You have drainage, redness, swelling, or pain at the site of the cyst.  You have a fever. This information is not intended to replace advice given to you by your health care provider. Make sure you discuss any questions you have with your health care provider. Document Released: 04/26/2000 Document Revised: 10/05/2015 Document Reviewed: 09/16/2013 Elsevier Interactive Patient Education  2018 [employer]

## 2018-04-02 NOTE — Progress Notes (Signed)
Surgical Clinic History and Physical  Referring provider:  No referring provider defined for this encounter.  HISTORY OF PRESENT ILLNESS (HPI):  32 y.o. female presents for evaluation of sacrococcygeal pain. Patient reports she first experienced sacrococcygeal pain and swelling over 4th of July weekend, 2015, at which time she felt a painful "lump" for which she sought ED evaluation and management, at which time she was prescribed antibiotics and it resolved. She's since noticed the area intermittently enlarges, shrinks, and has at least twice spontaneously drained on its own without intervention/drainage or antibiotics. However, she again presented to [facility] ED over the summers of 2018 and June, 2019, at which times she underwent incision and drainage and was prescribed antibiotics. Following her most recent incision and drainage, she says the antibiotics were too expensive, so she didn't fill the prescription and the wound healed on its own following drainage. When she experiences pain, she says it's difficult for her to work or perform her activities because it hurts her to sit. She does not shave the area, and she currently otherwise denies any further pain, fever/chills, drainage, N/V, constipation, CP, or SOB.  PAST MEDICAL HISTORY (PMH):  Past Medical History:  Diagnosis Date  . Asthma     PAST SURGICAL HISTORY (PSH):  History reviewed. No pertinent surgical history.   MEDICATIONS:  Prior to Admission medications   Medication Sig Start Date End Date Taking? Authorizing Provider  docusate sodium (COLACE) 100 MG capsule Take 1 capsule (100 mg total) by mouth 2 (two) times daily. 03/27/18 03/27/19 Yes Merrily Brittle, MD  naproxen (NAPROSYN) 500 MG tablet Take 1 tablet (500 mg total) by mouth 2 (two) times daily with a meal. 02/25/17  Yes Tommi Rumps, PA-C    ALLERGIES:  Allergies  Allergen Reactions  . Peanuts [Peanut Oil] Anaphylaxis  . Shellfish Allergy Anaphylaxis     SOCIAL HISTORY:  Social History   Socioeconomic History  . Marital status: Single    Spouse name: Not on file  . Number of children: Not on file  . Years of education: Not on file  . Highest education level: Not on file  Occupational History  . Not on file  Social Needs  . Financial resource strain: Not on file  . Food insecurity:    Worry: Not on file    Inability: Not on file  . Transportation needs:    Medical: Not on file    Non-medical: Not on file  Tobacco Use  . Smoking status: Current Every Day Smoker    Packs/day: 2.00    Types: Cigarettes  . Smokeless tobacco: Never Used  Substance and Sexual Activity  . Alcohol use: Yes  . Drug use: No  . Sexual activity: Yes    Birth control/protection: Injection  Lifestyle  . Physical activity:    Days per week: Not on file    Minutes per session: Not on file  . Stress: Not on file  Relationships  . Social connections:    Talks on phone: Not on file    Gets together: Not on file    Attends religious service: Not on file    Active member of club or organization: Not on file    Attends meetings of clubs or organizations: Not on file    Relationship status: Not on file  . Intimate partner violence:    Fear of current or ex partner: Not on file    Emotionally abused: Not on file    Physically abused: Not on  file    Forced sexual activity: Not on file  Other Topics Concern  . Not on file  Social History Narrative  . Not on file    The patient currently resides (home / rehab facility / nursing home): Home The patient normally is (ambulatory / bedbound): Ambulatory  FAMILY HISTORY:  Family History  Problem Relation Age of Onset  . Asthma Mother     Otherwise negative/non-contributory.  REVIEW OF SYSTEMS:  Constitutional: denies any other weight loss, fever, chills, or sweats  Eyes: denies any other vision changes, history of eye injury  ENT: denies sore throat, hearing problems  Respiratory: denies shortness  of breath, wheezing  Cardiovascular: denies chest pain, palpitations  Gastrointestinal: abdominal pain, N/V, and bowel function as per HPI Musculoskeletal: denies any other joint pains or cramps  Skin: Denies any other rashes or skin discolorations except as per HPI Neurological: denies any other headache, dizziness, weakness  Psychiatric: Denies any other depression, anxiety   All other review of systems were otherwise negative   VITAL SIGNS:  BP 119/76   Pulse 71   Temp 97.7 F (36.5 C) (Temporal)   Resp 16   Ht 5\' 1"  (1.549 m)   Wt 105 lb 6.4 oz (47.8 kg)   LMP 03/14/2018 (Exact Date)   SpO2 99%   BMI 19.92 kg/m    PHYSICAL EXAM:  Constitutional:  -- Normal body habitus  -- Awake, alert, and oriented x3  Eyes:  -- Pupils equally round and reactive to light  -- No scleral icterus  Ear, nose, throat:  -- No jugular venous distension -- No nasal drainage, bleeding Pulmonary:  -- No crackles  -- Equal breath sounds bilaterally -- Breathing non-labored at rest Cardiovascular:  -- S1, S2 present  -- No pericardial rubs  Gastrointestinal:  -- Abdomen soft, nontender, non-distended, no guarding/rebound  -- No abdominal masses appreciated, pulsatile or otherwise  Musculoskeletal and Integumentary:  -- Wounds or skin discoloration: 2.5 - 3 cm x 2 cm Left of midline focus of non-tender induration without surrounding/overlying erythema or appreciable fluctuance or drainage -- Extremities: B/L UE and LE FROM, hands and feet warm, no edema  Neurologic:  -- Motor function: Intact and symmetric -- Sensation: Intact and symmetric  Labs:  CBC Latest Ref Rng & Units 06/03/2015 09/15/2014  WBC 3.6 - 11.0 K/uL 14.3(H) 10.8  Hemoglobin 12.0 - 16.0 g/dL 10.5(L) 12.1  Hematocrit 35.0 - 47.0 % 32.5(L) 39.0  Platelets 150 - 440 K/uL 272 264   CMP Latest Ref Rng & Units 06/03/2015 09/15/2014  Glucose 65 - 99 mg/dL 161(W102(H) 81  BUN 6 - 20 mg/dL 13 13  Creatinine 9.600.44 - 1.00 mg/dL 4.540.61 0.980.61   Sodium 119135 - 145 mmol/L 139 135  Potassium 3.5 - 5.1 mmol/L 2.9(LL) 3.6  Chloride 101 - 111 mmol/L 109 102  CO2 22 - 32 mmol/L 23 24  Calcium 8.9 - 10.3 mg/dL 1.4(N8.7(L) 9.6  Total Protein 6.5 - 8.1 g/dL - 8.7(H)  Total Bilirubin 0.3 - 1.2 mg/dL - 1.0  Alkaline Phos 38 - 126 U/L - 32(L)  AST 15 - 41 U/L - 27  ALT 14 - 54 U/L - 16   Imaging studies: No new pertinent imaging studies available for review   Assessment/Plan:  32 y.o. 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   - natural history of pilonidal cysts and  management options were discussed  - all risks, benefits, and alternatives to excision of increasingly symptomatic no-longer-infected pilonidal cyst were discussed with the patient, including anticipated recovery and wound management options, all of her questions were answered to her expressed satisfaction, patient expresses she wishes to proceed (specifically stating "you will be hearing from me" to schedule, and informed consent was obtained.  - patient states she'd like to think about timing and plans to call office to schedule excision of pilonidal cyst +/- negative pressure wound VAC dressing  - anticipate return to clinic 2 weeks following above elective procedure(s)  - instructed to call if any questions or concerns  - smoking cessation encouraged  All of the above recommendations were discussed with the patient, and all of patient's questions were answered to her expressed satisfaction.  Thank you for the opportunity to participate in this patient's care.  -- Scherrie Gerlach Earlene Plater, MD, RPVI [place]:  [facility] for exceptional care. Office: [phone]

## 2018-05-01 ENCOUNTER — Encounter: Payer: Self-pay | Admitting: Emergency Medicine

## 2018-05-01 ENCOUNTER — Emergency Department
Admission: EM | Admit: 2018-05-01 | Discharge: 2018-05-01 | Disposition: A | Discharge status: Home / Self Care | Payer: Self-pay | Attending: Emergency Medicine | Admitting: Emergency Medicine

## 2018-05-01 ENCOUNTER — Emergency Department: Payer: Self-pay

## 2018-05-01 ENCOUNTER — Other Ambulatory Visit: Payer: Self-pay

## 2018-05-01 DIAGNOSIS — F17210 Nicotine dependence, cigarettes, uncomplicated: Secondary | ICD-10-CM | POA: Insufficient documentation

## 2018-05-01 DIAGNOSIS — Y999 Unspecified external cause status: Secondary | ICD-10-CM | POA: Insufficient documentation

## 2018-05-01 DIAGNOSIS — W109XXA Fall (on) (from) unspecified stairs and steps, initial encounter: Secondary | ICD-10-CM | POA: Insufficient documentation

## 2018-05-01 DIAGNOSIS — Y939 Activity, unspecified: Secondary | ICD-10-CM | POA: Insufficient documentation

## 2018-05-01 DIAGNOSIS — Z23 Encounter for immunization: Secondary | ICD-10-CM | POA: Insufficient documentation

## 2018-05-01 DIAGNOSIS — J45909 Unspecified asthma, uncomplicated: Secondary | ICD-10-CM | POA: Insufficient documentation

## 2018-05-01 DIAGNOSIS — Y929 Unspecified place or not applicable: Secondary | ICD-10-CM | POA: Insufficient documentation

## 2018-05-01 DIAGNOSIS — S0990XA Unspecified injury of head, initial encounter: Secondary | ICD-10-CM | POA: Insufficient documentation

## 2018-05-01 DIAGNOSIS — S0181XA Laceration without foreign body of other part of head, initial encounter: Secondary | ICD-10-CM | POA: Insufficient documentation

## 2018-05-01 MED ORDER — TETANUS-DIPHTH-ACELL PERTUSSIS 5-2.5-18.5 LF-MCG/0.5 IM SUSP
0.5000 mL | Freq: Once | INTRAMUSCULAR | Status: AC
  Administered 2018-05-01: 0.5 mL via INTRAMUSCULAR
  Filled 2018-05-01: qty 0.5

## 2018-05-01 MED ORDER — CEPHALEXIN 500 MG PO CAPS: 500.0000 mg | ORAL_CAPSULE | Freq: Three times a day (TID) | ORAL | 0 refills | Status: AC

## 2018-05-01 MED ORDER — LIDOCAINE HCL 1 % IJ SOLN
5.0000 mL | Freq: Once | INTRAMUSCULAR | Status: AC
  Administered 2018-05-01: 5 mL

## 2018-05-01 MED ORDER — LORAZEPAM 1 MG PO TABS
2.0000 mg | ORAL_TABLET | Freq: Once | ORAL | Status: AC
  Administered 2018-05-01: 2 mg via ORAL
  Filled 2018-05-01: qty 2

## 2018-05-01 MED ORDER — LIDOCAINE HCL (PF) 1 % IJ SOLN
INTRAMUSCULAR | Status: AC
  Administered 2018-05-01: 5 mL
  Filled 2018-05-01: qty 5

## 2018-05-01 MED ORDER — LIDOCAINE-EPINEPHRINE-TETRACAINE (LET) SOLUTION
3.0000 mL | Freq: Once | NASAL | Status: AC
  Administered 2018-05-01: 3 mL via TOPICAL
  Filled 2018-05-01: qty 3

## 2018-05-01 NOTE — ED Triage Notes (Signed)
Pt arrives ambulatory to triage with c/o head laceration which occurred as she tripped up the stairs last night. Pt denies LOC at this time and is in NAD.

## 2018-05-01 NOTE — ED Notes (Signed)
Of note, pt states that she had ETOH on board during the fall. Pt has about three inch laceration to forehead and nose at this time.

## 2018-05-01 NOTE — ED Provider Notes (Signed)
[facility] Emergency Department Provider Note  ____________________________________________  Time seen: Approximately 10:48 PM  I have reviewed the triage vital signs and the nursing notes.   HISTORY  Chief Complaint Laceration    HPI Tina Levy is a 32 y.o. female presents to the emergency department with a 6 cm, vertical, midline forehead laceration that has been open since approximately 3:00 this morning.  Patient reports that she was under the influence of alcohol when she tripped going up the stairs.  Patient does not remember much of the details surrounding the incident.  She has had mild headache today with no changes in vision.  No numbness or tingling in the upper or lower extremities.  She has been able to ambulate without difficulty.  She denies chest pain, chest tightness, shortness of breath, nausea, vomiting or abdominal pain.  No alleviating measures have been attempted.   Past Medical History:  Diagnosis Date  . Asthma     There are no active problems to display for this patient.   History reviewed. No pertinent surgical history.  Prior to Admission medications   Medication Sig Start Date End Date Taking? Authorizing Provider  cephALEXin (KEFLEX) 500 MG capsule Take 1 capsule (500 mg total) by mouth 3 (three) times daily for 7 days. 05/01/18 05/08/18  Orvil FeilWoods, Jaclyn M, PA-C  docusate sodium (COLACE) 100 MG capsule Take 1 capsule (100 mg total) by mouth 2 (two) times daily. 03/27/18 03/27/19  Merrily Brittleifenbark, Neil, MD  naproxen (NAPROSYN) 500 MG tablet Take 1 tablet (500 mg total) by mouth 2 (two) times daily with a meal. 02/25/17   Tommi RumpsSummers, Rhonda L, PA-C    Allergies Peanuts [peanut oil] and Shellfish allergy  Family History  Problem Relation Age of Onset  . Asthma Mother     Social History Social History   Tobacco Use  . Smoking status: Current Every Day Smoker    Packs/day: 2.00    Types: Cigarettes  . Smokeless tobacco:  Never Used  Substance Use Topics  . Alcohol use: Yes  . Drug use: No     Review of Systems  Constitutional: No fever/chills Eyes: No visual changes. No discharge ENT: No upper respiratory complaints. Cardiovascular: no chest pain. Respiratory: no cough. No SOB. Gastrointestinal: No abdominal pain.  No nausea, no vomiting.  No diarrhea.  No constipation. Genitourinary: Negative for dysuria. No hematuria Musculoskeletal: Negative for musculoskeletal pain. Skin: Patient has forehead laceration.  Neurological: Negative for headaches, focal weakness or numbness.   ____________________________________________   PHYSICAL EXAM:  VITAL SIGNS: ED Triage Vitals  Enc Vitals Group     BP 05/01/18 2045 118/76     Pulse Rate 05/01/18 2045 94     Resp 05/01/18 2045 18     Temp 05/01/18 2045 98.5 F (36.9 C)     Temp Source 05/01/18 2045 Oral     SpO2 05/01/18 2045 100 %     Weight 05/01/18 2040 108 lb (49 kg)     Height 05/01/18 2040 5\' 1"  (1.549 m)     Head Circumference --      Peak Flow --      Pain Score 05/01/18 2040 10     Pain Loc --      Pain Edu? --      Excl. in GC? --      Constitutional: Alert and oriented. Well appearing and in no acute distress. Eyes: Conjunctivae are normal. PERRL. EOMI. Head: Atraumatic. ENT:      Ears:  TMs are pearly.      Nose: No congestion/rhinnorhea.      Mouth/Throat: Mucous membranes are moist.  Neck: No stridor.  No cervical spine tenderness to palpation.  Cardiovascular: Normal rate, regular rhythm. Normal S1 and S2.  Good peripheral circulation. Respiratory: Normal respiratory effort without tachypnea or retractions. Lungs CTAB. Good air entry to the bases with no decreased or absent breath sounds. Gastrointestinal: Bowel sounds 4 quadrants. Soft and nontender to palpation. No guarding or rigidity. No palpable masses. No distention. No CVA tenderness. Musculoskeletal: Full range of motion to all extremities. No gross deformities  appreciated. Neurologic:  Normal speech and language. No gross focal neurologic deficits are appreciated.  Skin: Patient has a 6 cm, midline, vertical laceration that is somewhat jagged. Psychiatric: Mood and affect are normal. Speech and behavior are normal. Patient exhibits appropriate insight and judgement.   ____________________________________________   LABS (all labs ordered are listed, but only abnormal results are displayed)  Labs Reviewed - No data to display ____________________________________________  EKG   ____________________________________________  RADIOLOGY I personally viewed and evaluated these images as part of my medical decision making, as well as reviewing the written report by the radiologist.  Ct Head Wo Contrast  Result Date: 05/01/2018 CLINICAL DATA:  Status post fall, with laceration at the forehead and nose. Concern for head injury. Initial encounter. EXAM: CT HEAD WITHOUT CONTRAST CT MAXILLOFACIAL WITHOUT CONTRAST TECHNIQUE: Multidetector CT imaging of the head and maxillofacial structures were performed using the standard protocol without intravenous contrast. Multiplanar CT image reconstructions of the maxillofacial structures were also generated. COMPARISON:  None. FINDINGS: CT HEAD FINDINGS Brain: No evidence of acute infarction, hemorrhage, hydrocephalus, extra-axial collection or mass lesion/mass effect. The posterior fossa, including the cerebellum, brainstem and fourth ventricle, is within normal limits. The third and lateral ventricles, and basal ganglia are unremarkable in appearance. The cerebral hemispheres are symmetric in appearance, with normal gray-white differentiation. No mass effect or midline shift is seen. Vascular: No hyperdense vessel or unexpected calcification. Skull: There is no evidence of fracture; visualized osseous structures are unremarkable in appearance. Other: A soft tissue laceration is noted overlying the frontal calvarium. CT  MAXILLOFACIAL FINDINGS Osseous: There is no evidence of fracture or dislocation. The maxilla and mandible appear intact. The nasal bone is unremarkable in appearance. The visualized dentition demonstrates no acute abnormality. Orbits: The orbits are intact bilaterally. Sinuses: The visualized paranasal sinuses and mastoid air cells are well-aerated. Soft tissues: The soft tissue laceration is noted overlying the frontal calvarium. The parapharyngeal fat planes are preserved. The nasopharynx, oropharynx and hypopharynx are unremarkable in appearance. The visualized portions of the valleculae and piriform sinuses are grossly unremarkable. The parotid and submandibular glands are within normal limits. No cervical lymphadenopathy is seen. IMPRESSION: 1. No evidence of traumatic intracranial injury or fracture. 2. No evidence of fracture or dislocation with regard to the maxillofacial structures. 3. Soft tissue laceration overlying the frontal calvarium. Electronically Signed   By: Roanna Raider M.D.   On: 05/01/2018 22:19   Ct Maxillofacial Wo Contrast  Result Date: 05/01/2018 CLINICAL DATA:  Status post fall, with laceration at the forehead and nose. Concern for head injury. Initial encounter. EXAM: CT HEAD WITHOUT CONTRAST CT MAXILLOFACIAL WITHOUT CONTRAST TECHNIQUE: Multidetector CT imaging of the head and maxillofacial structures were performed using the standard protocol without intravenous contrast. Multiplanar CT image reconstructions of the maxillofacial structures were also generated. COMPARISON:  None. FINDINGS: CT HEAD FINDINGS Brain: No evidence of acute infarction, hemorrhage,  hydrocephalus, extra-axial collection or mass lesion/mass effect. The posterior fossa, including the cerebellum, brainstem and fourth ventricle, is within normal limits. The third and lateral ventricles, and basal ganglia are unremarkable in appearance. The cerebral hemispheres are symmetric in appearance, with normal gray-white  differentiation. No mass effect or midline shift is seen. Vascular: No hyperdense vessel or unexpected calcification. Skull: There is no evidence of fracture; visualized osseous structures are unremarkable in appearance. Other: A soft tissue laceration is noted overlying the frontal calvarium. CT MAXILLOFACIAL FINDINGS Osseous: There is no evidence of fracture or dislocation. The maxilla and mandible appear intact. The nasal bone is unremarkable in appearance. The visualized dentition demonstrates no acute abnormality. Orbits: The orbits are intact bilaterally. Sinuses: The visualized paranasal sinuses and mastoid air cells are well-aerated. Soft tissues: The soft tissue laceration is noted overlying the frontal calvarium. The parapharyngeal fat planes are preserved. The nasopharynx, oropharynx and hypopharynx are unremarkable in appearance. The visualized portions of the valleculae and piriform sinuses are grossly unremarkable. The parotid and submandibular glands are within normal limits. No cervical lymphadenopathy is seen. IMPRESSION: 1. No evidence of traumatic intracranial injury or fracture. 2. No evidence of fracture or dislocation with regard to the maxillofacial structures. 3. Soft tissue laceration overlying the frontal calvarium. Electronically Signed   By: Roanna RaiderJeffery  Chang M.D.   On: 05/01/2018 22:19    ____________________________________________    PROCEDURES  Procedure(s) performed:    Procedures  LACERATION REPAIR Performed by: Orvil FeilJaclyn M Woods Authorized by: Orvil FeilJaclyn M Woods Consent: Verbal consent obtained. Risks and benefits: risks, benefits and alternatives were discussed Consent given by: patient Patient identity confirmed: provided demographic data Prepped and Draped in normal sterile fashion Wound explored  Laceration Location: Forehead.   Laceration Length: 6 cm  No Foreign Bodies seen or palpated  Anesthesia: local infiltration  Local anesthetic: lidocaine 1%  without epinephrine  Anesthetic total: 5 ml  Irrigation method: syringe Amount of cleaning: standard  Skin closure: 5-0 Ethilon   Number of sutures: 10  Technique: Simple Interrupted   Patient tolerance: Patient tolerated the procedure well with no immediate complications.   Medications  LORazepam (ATIVAN) tablet 2 mg (2 mg Oral Given 05/01/18 2147)  lidocaine-EPINEPHrine-tetracaine (LET) solution (3 mLs Topical Given 05/01/18 2149)  lidocaine (XYLOCAINE) 1 % (with pres) injection 5 mL (5 mLs Infiltration Given 05/01/18 2220)     ____________________________________________   INITIAL IMPRESSION / ASSESSMENT AND PLAN / ED COURSE  Pertinent labs & imaging results that were available during my care of the patient were reviewed by me and considered in my medical decision making (see chart for details).  Review of the [place] CSRS was performed in accordance of the [facility] prior to dispensing any controlled drugs.    Assessment and plan Laceration repair Patient presents to the emergency department with a 6 cm, midline forehead laceration.  Laceration has been open since approximately 3:00 AM.  Patient is technically within the 24-hour window to have laceration repair.  Laceration repair is warranted due to large nature of laceration and cosmetic need.  Patient was advised to have sutures removed by primary care in 5 days.  She was discharged with Keflex.  Vital signs are reassuring prior to discharge.  All patient questions were answered.    ____________________________________________  FINAL CLINICAL IMPRESSION(S) / ED DIAGNOSES  Final diagnoses:  Facial laceration, initial encounter      NEW MEDICATIONS STARTED DURING THIS VISIT:  ED Discharge Orders  Ordered    cephALEXin (KEFLEX) 500 MG capsule  3 times daily     05/01/18 2242              This chart was dictated using voice recognition software/Dragon. Despite best efforts to proofread, errors can occur  which can change the meaning. Any change was purely unintentional.    Orvil Feil, PA-C 05/01/18 2253    Arnaldo Natal, MD 05/01/18 [phone]

## 2018-05-09 ENCOUNTER — Emergency Department
Admission: EM | Admit: 2018-05-09 | Discharge: 2018-05-09 | Disposition: A | Discharge status: Home / Self Care | Payer: Self-pay | Attending: Emergency Medicine | Admitting: Emergency Medicine

## 2018-05-09 ENCOUNTER — Other Ambulatory Visit: Payer: Self-pay

## 2018-05-09 ENCOUNTER — Encounter: Payer: Self-pay | Admitting: Emergency Medicine

## 2018-05-09 DIAGNOSIS — Z4802 Encounter for removal of sutures: Secondary | ICD-10-CM | POA: Insufficient documentation

## 2018-05-09 DIAGNOSIS — J45909 Unspecified asthma, uncomplicated: Secondary | ICD-10-CM | POA: Insufficient documentation

## 2018-05-09 DIAGNOSIS — F17210 Nicotine dependence, cigarettes, uncomplicated: Secondary | ICD-10-CM | POA: Insufficient documentation

## 2018-05-09 NOTE — ED Triage Notes (Signed)
Arrives for suture removal of forehead laceration.  Wound well approximated, clean and dry.

## 2018-05-09 NOTE — ED Provider Notes (Signed)
[facility] Emergency Department Provider Note  ____________________________________________  Time seen: Approximately 4:21 PM  I have reviewed the triage vital signs and the nursing notes.   HISTORY  Chief Complaint Suture / Staple Removal    HPI Tina Levy is a 32 y.o. female presents to the emergency department for suture removal.  Midline forehead laceration has been healing without complication.  Patient has no concerns.   Past Medical History:  Diagnosis Date  . Asthma     There are no active problems to display for this patient.   History reviewed. No pertinent surgical history.  Prior to Admission medications   Medication Sig Start Date End Date Taking? Authorizing Provider  docusate sodium (COLACE) 100 MG capsule Take 1 capsule (100 mg total) by mouth 2 (two) times daily. 03/27/18 03/27/19  Merrily Brittleifenbark, Neil, MD  naproxen (NAPROSYN) 500 MG tablet Take 1 tablet (500 mg total) by mouth 2 (two) times daily with a meal. 02/25/17   Tommi RumpsSummers, Rhonda L, PA-C    Allergies Peanuts [peanut oil] and Shellfish allergy  Family History  Problem Relation Age of Onset  . Asthma Mother     Social History Social History   Tobacco Use  . Smoking status: Current Every Day Smoker    Packs/day: 2.00    Types: Cigarettes  . Smokeless tobacco: Never Used  Substance Use Topics  . Alcohol use: Yes  . Drug use: No     Review of Systems  Constitutional: No fever/chills Eyes: No visual changes. No discharge ENT: No upper respiratory complaints. Cardiovascular: no chest pain. Respiratory: no cough. No SOB. Gastrointestinal: No abdominal pain.  No nausea, no vomiting.  No diarrhea.  No constipation. Musculoskeletal: Negative for musculoskeletal pain. Skin: Patient has healing forehead laceration.  Neurological: Negative for headaches, focal weakness or numbness.   ____________________________________________   PHYSICAL EXAM:  VITAL  SIGNS: ED Triage Vitals  Enc Vitals Group     BP 05/09/18 1529 106/67     Pulse Rate 05/09/18 1529 66     Resp 05/09/18 1529 12     Temp 05/09/18 1529 98.1 F (36.7 C)     Temp Source 05/09/18 1529 Oral     SpO2 05/09/18 1529 100 %     Weight 05/09/18 1500 108 lb (49 kg)     Height 05/09/18 1500 5\' 1"  (1.549 m)     Head Circumference --      Peak Flow --      Pain Score 05/09/18 1500 0     Pain Loc --      Pain Edu? --      Excl. in [place]? --      Constitutional: Alert and oriented. Well appearing and in no acute distress. Eyes: Conjunctivae are normal. PERRL. EOMI. Head: Atraumatic. Cardiovascular: Normal rate, regular rhythm. Normal S1 and S2.  Good peripheral circulation. Respiratory: Normal respiratory effort without tachypnea or retractions. Lungs CTAB. Good air entry to the bases with no decreased or absent breath sounds. Musculoskeletal: Full range of motion to all extremities. No gross deformities appreciated. Neurologic:  Normal speech and language. No gross focal neurologic deficits are appreciated.  Skin: Patient has healing laceration at forehead. Psychiatric: Mood and affect are normal. Speech and behavior are normal. Patient exhibits appropriate insight and judgement.   ____________________________________________   LABS (all labs ordered are listed, but only abnormal results are displayed)  Labs Reviewed - No data to display ____________________________________________  EKG   ____________________________________________  RADIOLOGY  No  results found.  ____________________________________________    PROCEDURES  Procedure(s) performed:    Procedures  SUTURE REMOVAL Performed by: Orvil FeilJaclyn M Woods  Consent: Verbal consent obtained. Patient identity confirmed: provided demographic data Time out: Immediately prior to procedure a "time out" was called to verify the correct patient, procedure, equipment, support staff and site/side marked as  required.  Location details: Forehead  Wound Appearance: clean  Sutures/Staples Removed: 10  Facility: sutures placed in this facility Patient tolerance: Patient tolerated the procedure well with no immediate complications.     Medications - No data to display   ____________________________________________   INITIAL IMPRESSION / ASSESSMENT AND PLAN / ED COURSE  Pertinent labs & imaging results that were available during my care of the patient were reviewed by me and considered in my medical decision making (see chart for details).  Review of the [place] CSRS was performed in accordance of the [facility] prior to dispensing any controlled drugs.      Assessment and Plan:  Suture removal Patient presents to the emergency department for forehead suture removal.  Sutures were removed in the emergency department.  Patient did have an approximately 1 cm region of superficial dehiscence at the proximal aspect of the laceration.  I applied a Steri-Strip to affected area.  Laceration repair was otherwise uncompromised. Patient was advised to apply vitamin E or Mederma to reduce scar formation.  Patient voiced understanding.  All patient questions were answered.    ____________________________________________  FINAL CLINICAL IMPRESSION(S) / ED DIAGNOSES  Final diagnoses:  Visit for suture removal      NEW MEDICATIONS STARTED DURING THIS VISIT:  ED Discharge Orders    None          This chart was dictated using voice recognition software/Dragon. Despite best efforts to proofread, errors can occur which can change the meaning. Any change was purely unintentional.    Orvil FeilWoods, Jaclyn M, PA-C 05/09/18 1626    Jene EveryKinner, Robert, MD 05/09/18 1739

## 2019-06-02 ENCOUNTER — Other Ambulatory Visit: Payer: Self-pay

## 2019-06-02 ENCOUNTER — Emergency Department
Admission: EM | Admit: 2019-06-02 | Discharge: 2019-06-02 | Disposition: A | Discharge status: Home / Self Care | Payer: Self-pay | Attending: Emergency Medicine | Admitting: Emergency Medicine

## 2019-06-02 DIAGNOSIS — F17210 Nicotine dependence, cigarettes, uncomplicated: Secondary | ICD-10-CM | POA: Insufficient documentation

## 2019-06-02 DIAGNOSIS — Z20822 Contact with and (suspected) exposure to covid-19: Secondary | ICD-10-CM | POA: Insufficient documentation

## 2019-06-02 DIAGNOSIS — J45909 Unspecified asthma, uncomplicated: Secondary | ICD-10-CM | POA: Insufficient documentation

## 2019-06-02 DIAGNOSIS — B349 Viral infection, unspecified: Secondary | ICD-10-CM | POA: Insufficient documentation

## 2019-06-02 MED ORDER — ONDANSETRON 4 MG PO TBDP: 4.0000 mg | ORAL_TABLET | Freq: Three times a day (TID) | ORAL | 0 refills | Status: DC | PRN

## 2019-06-02 NOTE — ED Triage Notes (Signed)
Pt c/o having diarrhea with chills/sweats and HA since last night. Denies any abd pain or vomiting.

## 2019-06-02 NOTE — ED Notes (Signed)
Pt called a second time from [facility] to tx rm with no answer.

## 2019-06-02 NOTE — ED Notes (Signed)
Pt called from [facility] to tx rm with no answer.  

## 2019-06-02 NOTE — ED Provider Notes (Signed)
[facility] Emergency Department Provider Note ____________________________________________   First MD Initiated Contact with Patient 06/02/19 1526     (approximate)  I have reviewed the triage vital signs and the nursing notes.   HISTORY  Chief Complaint Diarrhea and Headache  HPI Tina Levy is a 34 y.o. female with a history of asthma presents to the emergency department for treatment and evaluation of diarreah, nausea, vomiting, chills, sweats, and headache that started last night. She states that she has had exposure to COVID-19 at work. She has had several episodes of diarrhea and has vomited a few times. She has not vomited since drinking cranberry juice. She denies abdominal pain or dysuria. No loss of taste or smell. No alleviating measures attempted prior to arrival.         Past Medical History:  Diagnosis Date  . Asthma     There are no problems to display for this patient.   History reviewed. No pertinent surgical history.  Prior to Admission medications   Medication Sig Start Date End Date Taking? Authorizing Provider  naproxen (NAPROSYN) 500 MG tablet Take 1 tablet (500 mg total) by mouth 2 (two) times daily with a meal. 02/25/17   Letitia Neri L, PA-C  ondansetron (ZOFRAN-ODT) 4 MG disintegrating tablet Take 1 tablet (4 mg total) by mouth every 8 (eight) hours as needed for nausea or vomiting. 06/02/19   Angeli Demilio B, FNP    Allergies Peanuts [peanut oil] and Shellfish allergy  Family History  Problem Relation Age of Onset  . Asthma Mother     Social History Social History   Tobacco Use  . Smoking status: Current Every Day Smoker    Packs/day: 2.00    Types: Cigarettes  . Smokeless tobacco: Never Used  Substance Use Topics  . Alcohol use: Yes  . Drug use: No    Review of Systems  Constitutional: No fever/positive for chills Eyes: No visual changes. ENT: No sore throat. Cardiovascular: Denies chest  pain. Respiratory: Denies shortness of breath. Denies cough or wheezing. Gastrointestinal: No abdominal pain.  Positive for nausea, vomiting, and diarrhea. Genitourinary: Negative for dysuria. Musculoskeletal: Negative for back pain. Skin: Negative for rash. Neurological: Positive for headaches,negative for focal weakness or numbness. ____________________________________________   PHYSICAL EXAM:  VITAL SIGNS: ED Triage Vitals  Enc Vitals Group     BP 06/02/19 1311 140/75     Pulse Rate 06/02/19 1311 77     Resp 06/02/19 1311 17     Temp 06/02/19 1311 98.3 F (36.8 C)     Temp Source 06/02/19 1311 Oral     SpO2 06/02/19 1311 100 %     Weight 06/02/19 1312 110 lb (49.9 kg)     Height 06/02/19 1312 5' (1.524 m)     Head Circumference --      Peak Flow --      Pain Score 06/02/19 1312 10     Pain Loc --      Pain Edu? --      Excl. in [place]? --     Constitutional: Alert and oriented. Well appearing and in no acute distress. Eyes: Conjunctivae are normal. PERRL. EOMI. Head: Atraumatic. Nose: No congestion/rhinnorhea. Mouth/Throat: Mucous membranes are moist.  Oropharynx non-erythematous. Neck: No stridor.   Hematological/Lymphatic/Immunilogical: No cervical lymphadenopathy. Cardiovascular: Normal rate, regular rhythm. Grossly normal heart sounds.  Good peripheral circulation. Respiratory: Normal respiratory effort.  No retractions. Lungs CTAB. Gastrointestinal: Soft and nontender. No distention. No abdominal bruits. No  CVA tenderness. Genitourinary:  Musculoskeletal: No lower extremity tenderness nor edema.  No joint effusions. Neurologic:  Normal speech and language. No gross focal neurologic deficits are appreciated. No gait instability. Skin:  Skin is warm, dry and intact. No rash noted. Psychiatric: Mood and affect are normal. Speech and behavior are normal.  ____________________________________________   LABS (all labs ordered are listed, but only abnormal results are  displayed)  Labs Reviewed  SARS CORONAVIRUS 2 (TAT 6-24 HRS)   ____________________________________________  EKG  Not indicated. ____________________________________________  RADIOLOGY  ED MD interpretation:    Not indicated  Official radiology report(s): No results found.  ____________________________________________   PROCEDURES  Procedure(s) performed (including Critical Care):  Procedures  ____________________________________________   INITIAL IMPRESSION / ASSESSMENT AND PLAN     34 year old well appearing female presents to the ER for treatment and evaluation of symptoms as described in HPI. Exam is reassuring. No abdominal pain on exam.  Plan will be to test for COVID-19 and discharge home.  DIFFERENTIAL DIAGNOSIS  COVID-19, gastroenteritis, viral syndrome  ED COURSE  COVID-19 test sent. Prescription for Zofran submitted to her pharmacy. She will receive a work excuse and given instruction of quarantine in the event that her test is positive. She is to follow up with primary care or return to the ER for symptoms that change or worsen.  Tina Levy was evaluated in Emergency Department on 06/02/2019 for the symptoms described in the history of present illness. She was evaluated in the context of the global COVID-19 pandemic, which necessitated consideration that the patient might be at risk for infection with the SARS-CoV-2 virus that causes COVID-19. Institutional protocols and algorithms that pertain to the evaluation of patients at risk for COVID-19 are in a state of rapid change based on information released by regulatory bodies including the CDC and federal and state organizations. These policies and algorithms were followed during the patient's care in the ED.  ____________________________________________   FINAL CLINICAL IMPRESSION(S) / ED DIAGNOSES  Final diagnoses:  Acute viral syndrome  Exposure to COVID-19 virus     ED Discharge Orders           Ordered    ondansetron (ZOFRAN-ODT) 4 MG disintegrating tablet  Every 8 hours PRN     06/02/19 1539           Note:  This document was prepared using Dragon voice recognition software and may include unintentional dictation errors.   Chinita Pester, FNP 06/02/19 1553    Chesley Noon, MD 06/02/19 2350

## 2019-06-03 LAB — SARS CORONAVIRUS 2 (TAT 6-24 HRS): SARS Coronavirus 2: NEGATIVE

## 2021-07-25 ENCOUNTER — Emergency Department
Admission: EM | Admit: 2021-07-25 | Discharge: 2021-07-25 | Disposition: A | Discharge status: Home / Self Care | Payer: Self-pay | Attending: Emergency Medicine | Admitting: Emergency Medicine

## 2021-07-25 ENCOUNTER — Other Ambulatory Visit: Payer: Self-pay

## 2021-07-25 ENCOUNTER — Emergency Department: Payer: Self-pay

## 2021-07-25 ENCOUNTER — Encounter: Payer: Self-pay | Admitting: Emergency Medicine

## 2021-07-25 DIAGNOSIS — M549 Dorsalgia, unspecified: Secondary | ICD-10-CM

## 2021-07-25 DIAGNOSIS — M546 Pain in thoracic spine: Secondary | ICD-10-CM | POA: Insufficient documentation

## 2021-07-25 LAB — URINALYSIS, ROUTINE W REFLEX MICROSCOPIC
Bilirubin Urine: NEGATIVE
Glucose, UA: NEGATIVE mg/dL
Hgb urine dipstick: NEGATIVE
Ketones, ur: NEGATIVE mg/dL
Nitrite: NEGATIVE
Protein, ur: NEGATIVE mg/dL
Specific Gravity, Urine: 1.006 (ref 1.005–1.030)
pH: 5 (ref 5.0–8.0)

## 2021-07-25 LAB — POC URINE PREG, ED: Preg Test, Ur: NEGATIVE

## 2021-07-25 MED ORDER — METHOCARBAMOL 500 MG PO TABS: 500.0000 mg | ORAL_TABLET | Freq: Four times a day (QID) | ORAL | 0 refills | Status: DC

## 2021-07-25 MED ORDER — KETOROLAC TROMETHAMINE 30 MG/ML IJ SOLN
30.0000 mg | Freq: Once | INTRAMUSCULAR | Status: AC
  Administered 2021-07-25: 30 mg via INTRAMUSCULAR
  Filled 2021-07-25: qty 1

## 2021-07-25 MED ORDER — NAPROXEN 500 MG PO TABS: 500.0000 mg | ORAL_TABLET | Freq: Two times a day (BID) | ORAL | 0 refills | Status: DC

## 2021-07-25 NOTE — ED Provider Notes (Signed)
? ?[facility] ?Provider Note ? ? ? Event Date/Time  ? First MD Initiated Contact with Patient 07/25/21 0935   ?  (approximate) ? ? ?History  ? ?Back Pain ? ? ?HPI ? ?Tina Levy is a 36 y.o. female   presents to the ED with complaint of generalized back pain "all over".  Patient states she woke this morning with pain and has not taken any over-the-counter medications for this.  She also reports that it hurts in her back to take deep breaths.  She is unaware of any known injury and states that yesterday she did not do any extreme lifting of groceries such as cases of water.  She denies any urinary symptoms or history of kidney stones.  Denies a history of asthma and smokes daily.  Currently she rates her pain as a 10/10. ? ?  ? ? ?Physical Exam  ? ?Triage Vital Signs: ?ED Triage Vitals  ?Enc Vitals Group  ?   BP 07/25/21 0920 (!) 123/91  ?   Pulse Rate 07/25/21 0920 71  ?   Resp 07/25/21 0920 20  ?   Temp 07/25/21 0920 98.2 ?F (36.8 ?C)  ?   Temp Source 07/25/21 0920 Oral  ?   SpO2 07/25/21 0920 100 %  ?   Weight 07/25/21 0917 110 lb 0.2 oz (49.9 kg)  ?   Height 07/25/21 0917 5' (1.524 m)  ?   Head Circumference --   ?   Peak Flow --   ?   Pain Score 07/25/21 0917 10  ?   Pain Loc --   ?   Pain Edu? --   ?   Excl. in [place]? --   ? ? ?Most recent vital signs: ?Vitals:  ? 07/25/21 0920 07/25/21 1158  ?BP: (!) 123/91 116/75  ?Pulse: 71 62  ?Resp: 20 18  ?Temp: 98.2 ?F (36.8 ?C)   ?SpO2: 100% 100%  ? ? ? ?General: Awake, no distress.  Tearful. ?CV:  Good peripheral perfusion.  Heart regular rate and rhythm without murmur. ?Resp:  Normal effort.  Lungs are clear bilaterally. ?Abd:  No distention.  Soft, nontender. ?Other:  There is generalized tenderness on palpation of the thoracic spine and paravertebral muscles bilaterally.  Range of motion is slow and guarded secondary to discomfort.  Patient is able to move upper and lower extremities without any difficulty.  Good muscle strength bilaterally.   Patient is able to stand and ambulate without any assistance. ? ? ?ED Results / Procedures / Treatments  ? ?Labs ?(all labs ordered are listed, but only abnormal results are displayed) ?Labs Reviewed  ?URINALYSIS, ROUTINE W REFLEX MICROSCOPIC - Abnormal; Notable for the following components:  ?    Result Value  ? Color, Urine YELLOW (*)   ? APPearance HAZY (*)   ? Leukocytes,Ua TRACE (*)   ? Bacteria, UA RARE (*)   ? All other components within normal limits  ?POC URINE PREG, ED  ? ? ? ?RADIOLOGY ?Chest x-ray and thoracic spine images were reviewed by myself and no acute changes were noted.  Radiology report is negative for any cardiopulmonary disease.  Thoracic spine x-ray shows trace dextrocurvature centered at T6 which is unchanged from previous x-rays and minimal right T12-L1 disc space narrowing. ? ? ? ?PROCEDURES: ? ?Critical Care performed:  ? ?Procedures ? ? ?MEDICATIONS ORDERED IN ED: ?Medications  ?ketorolac (TORADOL) 30 MG/ML injection 30 mg (30 mg Intramuscular Given 07/25/21 1057)  ? ? ? ?IMPRESSION /  MDM / ASSESSMENT AND PLAN / ED COURSE  ?I reviewed the triage vital signs and the nursing notes. ? ? ?Differential diagnosis includes, but is not limited to, muscle skeletal pain/strain, thoracic degenerative changes, pneumonia causing back pain. ? ?36 year old female presents to the ED with complaint of generalized back pain when she woke up this morning.  Patient arrived to the ED tearful and states that she has not taken any over-the-counter medication for this.  She denies any previous injury to her back.  On exam patient is moderately tender to thoracic spine and paravertebral muscles bilaterally.  Range of motion is decreased due to increased pain.  Patient was given Toradol 30 mg IM and x-rays were obtained of her chest and thoracic spine.  Chest x-ray was reassuring and did not show any cardiovascular disease.  There was noted some dextrocurvature at T6 with some minimal disc space narrowing at  T12-L1 area.  Patient was made aware that the most likely this is more muscle skeletal in nature.  Urinalysis showed rare bacteria and trace leukocytes with a negative pregnancy test.  Patient was able to move with less pain prior to discharge.  We discussed continue with naproxen 500 mg twice daily and methocarbamol 500 mg every 6 hours as needed for muscle spasms.  She is aware that the muscle relaxant may cause drowsiness and it may be necessary that she only take it at bedtime.  We will follow-up with her PCP in [facility] if any continued problems. ? ? ? ?FINAL CLINICAL IMPRESSION(S) / ED DIAGNOSES  ? ?Final diagnoses:  ?Musculoskeletal back pain  ? ? ? ?Rx / DC Orders  ? ?ED Discharge Orders   ? ?      Ordered  ?  naproxen (NAPROSYN) 500 MG tablet  2 times daily with meals       ? 07/25/21 1214  ?  methocarbamol (ROBAXIN) 500 MG tablet  4 times daily       ? 07/25/21 1214  ? ?  ?  ? ?  ? ? ? ?Note:  This document was prepared using Dragon voice recognition software and may include unintentional dictation errors. ?  ?Johnn Hai, PA-C ?07/25/21 1222 ? ?  ?Delman Kitten, MD ?07/25/21 1702 ? ?

## 2021-07-25 NOTE — Discharge Instructions (Addendum)
Follow-up with your primary care provider at [facility] if any continued problems.  Begin taking the naproxen 500 mg twice daily with food.  The methocarbamol is a muscle relaxant and can be taken for every 6 hours as needed for muscle spasms however this is the medicine that I spoke about that can cause drowsiness.  It may be necessary for you to take it only at bedtime.  You may also use moist heat or ice packs to your back as needed for discomfort. ?

## 2021-07-25 NOTE — ED Triage Notes (Signed)
Pt comes into the ED via POV c/o back pain all over.  Pt states she woke up to the pain this morning.  Pt denies any known injury to the back and initially ambulated well into the ED lobby.  Pt tearful in triage at this time but has even and unlabored respirations.  ?

## 2021-11-17 ENCOUNTER — Emergency Department
Admission: EM | Admit: 2021-11-17 | Discharge: 2021-11-17 | Disposition: A | Discharge status: Home / Self Care | Payer: Self-pay | Attending: Emergency Medicine | Admitting: Emergency Medicine

## 2021-11-17 DIAGNOSIS — R21 Rash and other nonspecific skin eruption: Secondary | ICD-10-CM | POA: Insufficient documentation

## 2021-11-17 MED ORDER — PREDNISONE 10 MG PO TABS: ORAL_TABLET | ORAL | 0 refills | Status: DC

## 2021-11-17 MED ORDER — CEPHALEXIN 500 MG PO CAPS: 500.0000 mg | ORAL_CAPSULE | Freq: Three times a day (TID) | ORAL | 0 refills | Status: DC

## 2021-11-17 NOTE — ED Triage Notes (Signed)
Patient to ER via POV with complaints of rash present to face, back, chest, and bilateral arms that started on Thursday. Has taken benadryl without relief. 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

## 2021-11-17 NOTE — ED Provider Notes (Signed)
[facility] Provider Note    Event Date/Time   First MD Initiated Contact with Patient 11/17/21 1052     (approximate)   History   Rash   HPI  Tina Levy is a 36 y.o. female presents to the ED with complaint of rash to her face, back, chest, bilateral arms and abdomen.  Patient states areas have been itching.  She has taken Benadryl twice in the last 3 days without any relief.  She reports having a seafood and peanut allergy but is unaware of any exposure to these.  She denies any problems breathing or difficulty swallowing.     Physical Exam   Triage Vital Signs: ED Triage Vitals  Enc Vitals Group     BP 11/17/21 1044 122/90     Pulse Rate 11/17/21 1044 84     Resp 11/17/21 1044 20     Temp 11/17/21 1044 97.9 F (36.6 C)     Temp src --      SpO2 11/17/21 1044 100 %     Weight 11/17/21 1045 109 lb (49.4 kg)     Height 11/17/21 1045 5\' 1"  (1.549 m)     Head Circumference --      Peak Flow --      Pain Score 11/17/21 1044 0     Pain Loc --      Pain Edu? --      Excl. in [place]? --     Most recent vital signs: Vitals:   11/17/21 1044  BP: 122/90  Pulse: 84  Resp: 20  Temp: 97.9 F (36.6 C)  SpO2: 100%     General: Awake, no distress.  CV:  Good peripheral perfusion.  Heart regular rate and rhythm. Resp:  Normal effort.  Lungs are clear bilaterally. Abd:  No distention.  Other:  Face, trunk, upper extremities have a very fine erythematous papular rash noted at the hair follicles.  Nontender to palpation.  No distinct pattern.   ED Results / Procedures / Treatments   Labs (all labs ordered are listed, but only abnormal results are displayed) Labs Reviewed - No data to display     PROCEDURES:  Critical Care performed:   Procedures   MEDICATIONS ORDERED IN ED: Medications - No data to display   IMPRESSION / MDM / ASSESSMENT AND PLAN / ED COURSE  I reviewed the triage vital signs and the nursing  notes.   Differential diagnosis includes, but is not limited to, contact dermatitis, allergic dermatitis, folliculitis, rash etiology unknown.  36 year old female presents to the ED with complaint of rash for 3 days.  Patient has taken Benadryl once a day for the last 3 days without any relief.  On exam it is similar to folliculitis and there is no distinct pattern.  Face, torso, upper extremities are involved.  Patient was encouraged to begin taking Benadryl every 6 hours as needed for itching.  We will treat with prednisone 6-day taper and cephalexin 500 mg 3 times daily for 7 days.  She is to follow-up with [place] [facility] dermatology if any continued problems or not improving.      Patient's presentation is most consistent with acute, uncomplicated illness.  FINAL CLINICAL IMPRESSION(S) / ED DIAGNOSES   Final diagnoses:  Rash and nonspecific skin eruption     Rx / DC Orders   ED Discharge Orders          Ordered    predniSONE (DELTASONE) 10 MG tablet  11/17/21 1134    cephALEXin (KEFLEX) 500 MG capsule  3 times daily        11/17/21 1134             Note:  This document was prepared using Dragon voice recognition software and may include unintentional dictation errors.   Tommi Rumps, PA-C 11/17/21 1139    Minna Antis, MD 11/17/21 1406

## 2021-11-17 NOTE — ED Notes (Signed)
Patient placed into a gown. ?

## 2021-11-17 NOTE — ED Notes (Signed)
Dc ppw provided to patient. followup and RX information reviewed. Pt provides verbal consent for dc. Pt ambulatory to lobby alert and oriented.

## 2021-11-17 NOTE — ED Notes (Signed)
Thursday started having a fine rash over her upper body with itching. States it stings and itches.

## 2021-11-17 NOTE — Discharge Instructions (Addendum)
Call make an appointment with [facility] or [facility] if not improving.  These are the 2 offices that have skin specialist and each office has several doctors working within that office.  Begin medication as directed.  If you continue to have itching you should take 1 Benadryl every 6 hours which would help more than taking it at once a day.

## 2021-11-17 NOTE — ED Notes (Signed)
Patient denies eating peanuts or seafood since she is allergic to it. She denies changing any skin products or laundry detergents.

## 2021-12-13 ENCOUNTER — Encounter: Payer: Self-pay | Admitting: Emergency Medicine

## 2021-12-13 ENCOUNTER — Emergency Department
Admission: EM | Admit: 2021-12-13 | Discharge: 2021-12-13 | Disposition: A | Discharge status: Home / Self Care | Payer: Self-pay | Attending: Emergency Medicine | Admitting: Emergency Medicine

## 2021-12-13 DIAGNOSIS — L0231 Cutaneous abscess of buttock: Secondary | ICD-10-CM | POA: Insufficient documentation

## 2021-12-13 MED ORDER — SULFAMETHOXAZOLE-TRIMETHOPRIM 800-160 MG PO TABS
1.0000 | ORAL_TABLET | Freq: Once | ORAL | Status: AC
  Administered 2021-12-13: 1 via ORAL
  Filled 2021-12-13: qty 1

## 2021-12-13 MED ORDER — HYDROMORPHONE HCL 1 MG/ML IJ SOLN
1.0000 mg | Freq: Once | INTRAMUSCULAR | Status: AC
Start: 2021-12-13 — End: 2021-12-13
  Administered 2021-12-13: 1 mg via INTRAMUSCULAR
  Filled 2021-12-13: qty 1

## 2021-12-13 MED ORDER — SULFAMETHOXAZOLE-TRIMETHOPRIM 800-160 MG PO TABS: 1.0000 | ORAL_TABLET | Freq: Two times a day (BID) | ORAL | 0 refills | Status: DC

## 2021-12-13 MED ORDER — OXYCODONE HCL 5 MG PO TABS
5.0000 mg | ORAL_TABLET | Freq: Three times a day (TID) | ORAL | 0 refills | Status: DC | PRN
Start: 2021-12-13 — End: 2021-12-20

## 2021-12-13 MED ORDER — SULFAMETHOXAZOLE-TRIMETHOPRIM 800-160 MG PO TABS
1.0000 | ORAL_TABLET | Freq: Once | ORAL | Status: AC
  Administered 2021-12-13: 1 via ORAL

## 2021-12-13 MED ORDER — ACETAMINOPHEN 500 MG PO TABS
1000.0000 mg | ORAL_TABLET | Freq: Once | ORAL | Status: AC
  Administered 2021-12-13: 1000 mg via ORAL
  Filled 2021-12-13: qty 2

## 2021-12-13 MED ORDER — IBUPROFEN 600 MG PO TABS
600.0000 mg | ORAL_TABLET | Freq: Once | ORAL | Status: AC
  Administered 2021-12-13: 600 mg via ORAL
  Filled 2021-12-13: qty 1

## 2021-12-13 MED ORDER — LIDOCAINE-EPINEPHRINE 2 %-1:100000 IJ SOLN
20.0000 mL | Freq: Once | INTRAMUSCULAR | Status: AC
  Administered 2021-12-13: 20 mL
  Filled 2021-12-13: qty 1

## 2021-12-13 NOTE — ED Provider Notes (Signed)
[facility] Provider Note    Event Date/Time   First MD Initiated Contact with Patient 12/13/21 [phone]     (approximate)   History   Abscess   HPI  Tina Levy is a 36 y.o. female who presents to the ED for evaluation of Abscess   Patient presents to the ED with her mother for evaluation of a painful spot or abscess to the left superior gluteal cleft.  Been present for the past 4 days and increasingly worsening.  No recent antibiotics.  Has had similar syndrome a few years ago, but nothing more recently.  Denies fevers or systemic symptoms.  Reports severe pain to her buttocks.   Physical Exam   Triage Vital Signs: ED Triage Vitals [12/13/21 0534]  Enc Vitals Group     BP 113/68     Pulse Rate 99     Resp 16     Temp 98.2 F (36.8 C)     Temp Source Oral     SpO2 98 %     Weight 107 lb (48.5 kg)     Height 5\' 1"  (1.549 m)     Head Circumference      Peak Flow      Pain Score      Pain Loc      Pain Edu?      Excl. in GC?     Most recent vital signs: Vitals:   12/13/21 0600 12/13/21 0615  BP: 129/84   Pulse: 88 88  Resp:    Temp:    SpO2:      General: Awake, no distress.  CV:  Good peripheral perfusion.  Resp:  Normal effort.  Abd:  No distention.  MSK:  To the superior aspect of the left gluteal cleft, inferior to where I would expect a pilonidal abscess, she has a obliquely oriented 8 cm area of induration and fluctuance, coming to ahead medially, not crossing midline.  No active discharge or bleeding. Neuro:  No focal deficits appreciated. Other:     ED Results / Procedures / Treatments   Labs (all labs ordered are listed, but only abnormal results are displayed) Labs Reviewed - No data to display  EKG   RADIOLOGY   Official radiology report(s): No results found.  PROCEDURES and INTERVENTIONS:  .10/03/23Incision and Drainage  Date/Time: 12/13/2021 7:11 AM  Performed by: 02/12/2022, MD Authorized by: Delton Prairie, MD   Consent:    Consent obtained:  Verbal   Consent given by:  Patient and parent   Risks, benefits, and alternatives were discussed: yes     Risks discussed:  Bleeding, damage to other organs, incomplete drainage, infection and pain Location:    Type:  Abscess   Size:  8   Location:  Anogenital   Anogenital location:  Gluteal cleft Pre-procedure details:    Skin preparation:  Povidone-iodine Sedation:    Sedation type:  Anxiolysis Anesthesia:    Anesthesia method:  Local infiltration   Local anesthetic:  Lidocaine 2% WITH epi Procedure type:    Complexity:  Complex Procedure details:    Incision types:  Cruciate   Wound management:  Probed and deloculated   Drainage:  Bloody and purulent   Drainage amount:  Copious   Wound treatment:  Drain placed   Packing materials:  1/2 in iodoform gauze   Amount 1/2" iodoform:  4inch Post-procedure details:    Procedure completion:  Tolerated well, no immediate complications   Medications  lidocaine-EPINEPHrine (XYLOCAINE W/EPI) 2 %-1:100000 (with pres) injection 20 mL (has no administration in time range)  HYDROmorphone (DILAUDID) injection 1 mg (1 mg Intramuscular Given 12/13/21 0619)  acetaminophen (TYLENOL) tablet 1,000 mg (1,000 mg Oral Given 12/13/21 0619)  ibuprofen (ADVIL) tablet 600 mg (600 mg Oral Given 12/13/21 0619)  sulfamethoxazole-trimethoprim (BACTRIM DS) 800-160 MG per tablet 1 tablet (1 tablet Oral Given 12/13/21 1610)     IMPRESSION / MDM / ASSESSMENT AND PLAN / ED COURSE  I reviewed the triage vital signs and the nursing notes.  Differential diagnosis includes, but is not limited to, sepsis, abscess, cellulitis, fistula to her colon  {Patient presents with symptoms of an acute illness or injury that is potentially life-threatening.  36 year old female presents to the ED with recurrence of a gluteal cleft/pilonidal abscess requiring bedside I&D, initiation of antibiotics and surgical follow-up.  No fevers or  systemic symptoms.  Doubt sepsis bacteremia.  Performed.  I&D with return of very large volume of purulent material and resolution of her pain.  Packed with iodoform gauze.  Started on 10 days of Bactrim and provided surgical follow-up.  We discussed return precautions.  Clinical Course as of 12/13/21 0707  Thu Dec 13, 2021  0702 I&D performed, well tolerated [DS]    Clinical Course User Index [DS] Delton Prairie, MD     FINAL CLINICAL IMPRESSION(S) / ED DIAGNOSES   Final diagnoses:  Abscess of buttock, left     Rx / DC Orders   ED Discharge Orders          Ordered    sulfamethoxazole-trimethoprim (BACTRIM DS) 800-160 MG tablet  2 times daily        12/13/21 0704    oxyCODONE (ROXICODONE) 5 MG immediate release tablet  Every 8 hours PRN        12/13/21 9604             Note:  This document was prepared using Dragon voice recognition software and may include unintentional dictation errors.   Delton Prairie, MD 12/13/21 [phone]

## 2021-12-13 NOTE — ED Triage Notes (Signed)
Pt with c/o sacral abscess since Monday without drainage. Pt walking bent over, slow and with cane due to pain. Pt sts she has had abscess before in same area. Pt denies fever.

## 2021-12-13 NOTE — ED Notes (Addendum)
Patient threw up the Bactrim after administration stating that the pill was too big

## 2021-12-13 NOTE — Discharge Instructions (Addendum)
Please take Tylenol and ibuprofen/Advil for your pain.  It is safe to take them together, or to alternate them every few hours.  Take up to 1000mg  of Tylenol at a time, up to 4 times per day.  Do not take more than 4000 mg of Tylenol in 24 hours.  For ibuprofen, take 400-600 mg, 3 - 4 times per day.  Use the oxycodone as needed for more severe/breakthrough pain.  Please take the Bactrim antibiotic twice daily for the next 10 days.  Finish all 20 tablets, even if it is feeling better.  As we discussed, there is a few inches of the packing tape within the abscess space.  Try to leave this in there for as long as it will stay and it should fall out on its own.  Try not to pull it out.  It is normal they will be draining some blood or pus.  As we discussed, considering the large size and extent of this abscess, please follow-up with the surgeon in the clinic for recheck after 1-2 weeks.  If things worsen despite this, fevers, uncontrolled pain please return to the ED.

## 2021-12-20 ENCOUNTER — Encounter: Payer: Self-pay | Admitting: Physician Assistant

## 2021-12-20 ENCOUNTER — Ambulatory Visit (INDEPENDENT_AMBULATORY_CARE_PROVIDER_SITE_OTHER): Payer: Self-pay | Admitting: Physician Assistant

## 2021-12-20 VITALS — BP 135/82 | HR 92 | Temp 98.7°F | Wt 118.4 lb

## 2021-12-20 DIAGNOSIS — L0231 Cutaneous abscess of buttock: Secondary | ICD-10-CM

## 2021-12-20 NOTE — Patient Instructions (Signed)
If you have any concerns or questions, please feel free to call our office.   Incision and Drainage, Care After This sheet gives you information about how to care for yourself after your procedure. Your health care provider may also give you more specific instructions. If you have problems or questions, contact your health care provider. What can I expect after the procedure? After the procedure, it is common to have: Pain or discomfort around the incision site. Blood, fluid, or pus (drainage) from the incision. Redness and firm skin around the incision site. Follow these instructions at home: Medicines Take over-the-counter and prescription medicines only as told by your health care provider. If you were prescribed an antibiotic medicine, use or take it as told by your health care provider. Do not stop using the antibiotic even if you start to feel better. Wound care Follow instructions from your health care provider about how to take care of your wound. Make sure you: Wash your hands with soap and water before and after you change your bandage (dressing). If soap and water are not available, use hand sanitizer. Change your dressing and packing as told by your health care provider. If your dressing is dry or stuck when you try to remove it, moisten or wet the dressing with saline or water so that it can be removed without harming your skin or tissues. If your wound is packed, leave it in place until your health care provider tells you to remove it. To remove the packing, moisten or wet the packing with saline or water so that it can be removed without harming your skin or tissues. Leave stitches (sutures), skin glue, or adhesive strips in place. These skin closures may need to stay in place for 2 weeks or longer. If adhesive strip edges start to loosen and curl up, you may trim the loose edges. Do not remove adhesive strips completely unless your health care provider tells you to do that. Check  your wound every day for signs of infection. Check for: More redness, swelling, or pain. More fluid or blood. Warmth. Pus or a bad smell. If you were sent home with a drain tube in place, follow instructions from your health care provider about: How to empty it. How to care for it at home.  General instructions Rest the affected area. Do not take baths, swim, or use a hot tub until your health care provider approves. Ask your health care provider if you may take showers. You may only be allowed to take sponge baths. Return to your normal activities as told by your health care provider. Ask your health care provider what activities are safe for you. Your health care provider may put you on activity or lifting restrictions. The incision will continue to drain. It is normal to have some clear or slightly bloody drainage. The amount of drainage should lessen each day. Do not apply any creams, ointments, or liquids unless you have been told to by your health care provider. Keep all follow-up visits as told by your health care provider. This is important. Contact a health care provider if: Your cyst or abscess returns. You have more redness, swelling, or pain around your incision. You have more fluid or blood coming from your incision. Your incision feels warm to the touch. You have pus or a bad smell coming from your incision. You have red streaks above or below the incision site. Get help right away if: You have severe pain or bleeding. You cannot  eat or drink without vomiting. You have a fever or chills. You have redness that spreads quickly. You have decreased urine output. You become short of breath. You have chest pain. You cough up blood. The affected area becomes numb or starts to tingle. These symptoms may represent a serious problem that is an emergency. Do not wait to see if the symptoms will go away. Get medical help right away. Call your local emergency services (911 in the  [place] Do not drive yourself to the hospital. Summary After this procedure, it is common to have fluid, blood, or pus coming from the surgery site. Follow all home care instructions. You will be told how to take care of your incision, how to check for infection, and how to take medicines. If you were prescribed an antibiotic medicine, take it as told by your health care provider. Do not stop taking the antibiotic even if you start to feel better. Contact a health care provider if you have increased redness, swelling, or pain around your incision. Get help right away if you have chest pain, you vomit, you cough up blood, or you have shortness of breath. Keep all follow-up visits as told by your health care provider. This is important. This information is not intended to replace advice given to you by your health care provider. Make sure you discuss any questions you have with your health care provider. Document Revised: 08/02/2021 Document Reviewed: 02/08/2021 Elsevier Patient Education  2023 [employer].

## 2021-12-20 NOTE — Progress Notes (Signed)
[facility] NEW PATIENT   Referring provider:  Antoine Primas, MD (Emergency Medicine)  HISTORY OF PRESENT ILLNESS (HPI):  36 y.o. female presents for evaluation of abscess. Patient presented to [facility] ED on 08/03 secondary to an abscess on her left superior gluteal cleft. At that time, she had reported about 4 days of pain which had been getting worse. No fever, chills at the time. She does report she has had something similar but this was years ago. I&D was preformed by EDP yielding purulent drainage. Cx were not taken. This was packed with 1/2 inch iodoform gauze. She was started on 10 days of Bactrim and referred to our clinic.   Today, she reports that she has been doing well at home aside from mild discomfort because of the packing. She has not changed this because she was told "it would fall out," which it has not. She has just been changing superficial dressings. Unfortunately, she did not pick up her prescription of Abx secondary to the cost. No fever, chills at home. No other new complaints.    PAST MEDICAL HISTORY (PMH):  Past Medical History:  Diagnosis Date   Asthma      PAST SURGICAL HISTORY (PSH):  No past surgical history on file.   MEDICATIONS:  Prior to Admission medications   Medication Sig Start Date End Date Taking? Authorizing Provider  cephALEXin (KEFLEX) 500 MG capsule Take 1 capsule (500 mg total) by mouth 3 (three) times daily. Patient not taking: Reported on 12/13/2021 11/17/21   Tommi Rumps, PA-C  oxyCODONE (ROXICODONE) 5 MG immediate release tablet Take 1 tablet (5 mg total) by mouth every 8 (eight) hours as needed for breakthrough pain or severe pain. 12/13/21 12/13/22  Delton Prairie, MD  predniSONE (DELTASONE) 10 MG tablet Take 6 tablets  today, on day 2 take 5 tablets, day 3 take 4 tablets, day 4 take 3 tablets, day 5 take  2 tablets and 1 tablet the last day Patient not taking: Reported on 12/13/2021 11/17/21   Tommi Rumps,  PA-C  sulfamethoxazole-trimethoprim (BACTRIM DS) 800-160 MG tablet Take 1 tablet by mouth 2 (two) times daily for 10 days. 12/13/21 12/23/21  Delton Prairie, MD     ALLERGIES:  Allergies  Allergen Reactions   Peanuts [Peanut Oil] Anaphylaxis   Shellfish Allergy Anaphylaxis     SOCIAL HISTORY:  Social History   Socioeconomic History   Marital status: Single    Spouse name: Not on file   Number of children: Not on file   Years of education: Not on file   Highest education level: Not on file  Occupational History   Not on file  Tobacco Use   Smoking status: Every Day    Packs/day: 2.00    Types: Cigarettes   Smokeless tobacco: Never  Substance and Sexual Activity   Alcohol use: Yes   Drug use: No   Sexual activity: Yes    Birth control/protection: Injection  Other Topics Concern   Not on file  Social History Narrative   Not on file   Social Determinants of Health   Financial Resource Strain: Not on file  Food Insecurity: Not on file  Transportation Needs: Not on file  Physical Activity: Not on file  Stress: Not on file  Social Connections: Not on file  Intimate Partner Violence: Not on file     FAMILY HISTORY:  Family History  Problem Relation Age of Onset   Asthma Mother  Otherwise negative/non-contributory.  REVIEW OF SYSTEMS:  Review of Systems  Constitutional:  Negative for chills and fever.  Respiratory:  Negative for cough and shortness of breath.   Cardiovascular:  Negative for chest pain and palpitations.  Gastrointestinal:  Negative for abdominal pain, nausea and vomiting.  Genitourinary:  Negative for dysuria and urgency.  Skin:  Negative for itching and rash.       + Left Gluteal Abscess   All other systems reviewed and are negative.    All other review of systems were otherwise negative   VITAL SIGNS:  There were no vitals filed for this visit.   PHYSICAL EXAM:  Physical Exam Vitals and nursing note reviewed. Exam conducted with a  chaperone present.  Constitutional:      General: She is not in acute distress.    Appearance: Normal appearance. She is normal weight. She is not ill-appearing.     Comments: Patient sitting in bed; no acute distress  HENT:     Head: Normocephalic and atraumatic.  Eyes:     General: No scleral icterus.    Conjunctiva/sclera: Conjunctivae normal.  Pulmonary:     Effort: Pulmonary effort is normal. No respiratory distress.  Genitourinary:    Comments: Deferred Skin:    General: Skin is warm and dry.     Findings: Abscess (Drained) and wound present.          Comments: Marylene Land present to chaperone, to the superior left gluteal cleft there is a small ~1 cm I&D site, packing still in place (removed), the surrounding tissue is indurated, no erythema. There is a scant amount of seropurulent drainage expressible. I do not think there is any undrained abscess at this time.   Neurological:     General: No focal deficit present.     Mental Status: She is alert and oriented to person, place, and time.  Psychiatric:        Mood and Affect: Mood normal.        Behavior: Behavior normal.       Labs: None; no Cx taken on 08/03 visit   Imaging studies:  No new pertinent imaging studies    Assessment/Plan:  35 y.o. female with left superior gluteal cleft abscess s/p I&D by EDP on 08/03   - I was able to remove packing; small amount of drainage expressed but no evidence of undrained collection. Thre is very minimal depth to the wound so packing not replaced. Encouraged to continue with superficial dressings daily + prn - We were able to get her assistance with obtaining Abx; encouraged her to do so and complete the course as prescribed - Reviewed wound care recommendations; no baths/swimming   - Pain control prn; OTC medications seem to be sufficient   - I would like to see her again in ~1 week to ensure this improves and has not worsened.  All of the above recommendations were discussed  with the patient, and all of patient's questions were answered to her expressed satisfaction.  Thank you for the opportunity to participate in this patient's care.  Face-to-face time spent with the patient and care providers was 30 minutes, with more than 50% of the time spent counseling, educating, and coordinating care of the patient.    -- Lynden Oxford, PA-C [facility] 12/20/2021, 3:16 PM M-F: 7am - 4pm

## 2021-12-27 ENCOUNTER — Ambulatory Visit (INDEPENDENT_AMBULATORY_CARE_PROVIDER_SITE_OTHER): Payer: Self-pay | Admitting: Physician Assistant

## 2021-12-27 ENCOUNTER — Encounter: Payer: Self-pay | Admitting: Physician Assistant

## 2021-12-27 ENCOUNTER — Other Ambulatory Visit: Payer: Self-pay

## 2021-12-27 VITALS — BP 124/75 | HR 87 | Temp 99.3°F | Ht 61.0 in | Wt 115.4 lb

## 2021-12-27 DIAGNOSIS — L0231 Cutaneous abscess of buttock: Secondary | ICD-10-CM

## 2021-12-27 NOTE — Progress Notes (Signed)
[facility] NOTE  12/27/2021  History of Present Illness: Tina Levy is a 36 y.o. female seen last week for ED follow up following I&D of abscess to her left superior gluteal crease by EDP on 08/03. She had been doing well at our last visit. We were able to get her assistance with Abx and she has 3 days left of these. Her pain is gone now and the wound is closed. Her biggest complaint is itching. No fever, chills. No other new symptoms. She is otherwise doing well.   Past Medical History: Past Medical History:  Diagnosis Date   Asthma      Past Surgical History: History reviewed. No pertinent surgical history.  Home Medications: Prior to Admission medications   Not on File    Allergies: Allergies  Allergen Reactions   Peanuts [Peanut Oil] Anaphylaxis   Shellfish Allergy Anaphylaxis    Review of Systems: Review of Systems  Constitutional:  Negative for chills and fever.  Respiratory:  Negative for cough and sputum production.   Cardiovascular:  Negative for chest pain and palpitations.  Gastrointestinal:  Negative for diarrhea, nausea and vomiting.  Skin:  Positive for itching. Negative for rash.       + Left gluteal Abscess (resolved)  All other systems reviewed and are negative.   Physical Exam BP 124/75   Pulse 87   Temp 99.3 F (37.4 C) (Oral)   Ht 5\' 1"  (1.549 m)   Wt 115 lb 6.4 oz (52.3 kg)   SpO2 99%   BMI 21.80 kg/m   Physical Exam Vitals and nursing note reviewed. Exam conducted with a chaperone present.  Constitutional:      General: She is not in acute distress.    Appearance: Normal appearance. She is normal weight. She is not ill-appearing.     Comments: Patient sitting in bed; NAD  Eyes:     General: No scleral icterus.    Conjunctiva/sclera: Conjunctivae normal.  Pulmonary:     Effort: Pulmonary effort is normal. No respiratory distress.  Genitourinary:    Comments: Deferred Musculoskeletal:     Right  lower leg: No edema.     Left lower leg: No edema.  Skin:    General: Skin is warm and dry.     Comments: Previous wound to left superior gluteal crease is completely healed; no erythema, no induration nor fluctuance  Neurological:     General: No focal deficit present.     Mental Status: She is alert and oriented to person, place, and time.  Psychiatric:        Mood and Affect: Mood normal.        Behavior: Behavior normal.     Labs/Imaging: No new labs or imaging studies  Assessment and Plan: This is a 36 y.o. female with now healed left superior gluteal cleft abscess s/p I&D by EDP on 08/03   - Nothing further from surgical perspective  - Complete Abx as prescribed  - She can follow up on as needed basis. She understands she can call with questions/concerns in the future.   Face-to-face time spent with the patient and care providers was 10 minutes, with more than 50% of the time spent counseling, educating, and coordinating care of the patient.     10/03, PA-C [facility] 12/27/2021, 2:20 PM M-F: 7am - 4pm

## 2021-12-27 NOTE — Patient Instructions (Signed)
Please call the office with any questions or concerns. Finish the full course of Antibiotics.

## 2022-03-25 ENCOUNTER — Ambulatory Visit: Payer: Self-pay | Admitting: Surgery

## 2022-03-25 NOTE — H&P (Signed)
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 female who was referred by Lorane Gell, MD for evaluation of above. First noted several months ago. Episodic abscess formation requiring I&D or spontaneously drained.  Currently asymptomatic.   Past Medical History:  has no past medical history on file.   Past Surgical History:  has no past surgical history on file.   Family History: family history is not on file.   Social History:  reports that she has been smoking cigarettes. She has never used smokeless tobacco. She reports current alcohol use. She reports that she does not use drugs.   Current Medications: currently has no medications in their medication list.   Allergies:       Allergies  Allergen Reactions   Others Other (See Comments)      Uncoded Allergy. Allergen: seafood, Other Reaction: Other reaction   Peanut Other (See Comments)      Other Reaction: Other reaction      ROS:  A 15 point review of systems was performed and pertinent positives and negatives noted in HPI   Objective:    BP 128/80   Pulse 65   Ht 154.9 cm (5\' 1" )   Wt 51.3 kg (113 lb)   LMP 03/04/2022   BMI 21.35 kg/m    Constitutional :  No distress, cooperative, alert  Lymphatics/Throat:  Supple with no lymphadenopathy  Respiratory:  Clear to auscultation bilaterally  Cardiovascular:  Regular rate and rhythm  Gastrointestinal: Soft, non-tender, non-distended, no organomegaly.  Musculoskeletal: Steady gait and movement  Skin: Cool and moist. Chaperone present for exam.  Evidence of healed abscess formation at top of gluteal cleft on left side.  Small midline pit noted at apex.  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 Pilonidal disease [L98.8] Discussed surgical excision and bascom flap procedure.  Alternatives include  continued observation.  Benefits include possible symptom relief, pathologic evaluation, improved cosmesis. Discussed the risk of surgery including recurrence, chronic pain, post-op infxn, poor cosmesis, poor/delayed wound healing, and possible re-operation to address said risks. The risks of general anesthetic, if used, includes MI, CVA, sudden death or even reaction to anesthetic medications also discussed.  Typical post-op recovery time of 3-5 days with possible activity restrictions were also discussed.   The patient verbalized understanding and all questions were answered to the patient's satisfaction.   2. Patient has elected to proceed with surgical treatment. Procedure will be scheduled in couple months to allow patient time to stop smoking, decreasing chance of perioperative complications noted above.  Emphasized importance of smoking cessation.       labs/images/medications/previous chart entries reviewed personally and relevant changes/updates noted above.

## 2022-03-28 ENCOUNTER — Other Ambulatory Visit: Payer: Self-pay

## 2022-03-28 ENCOUNTER — Emergency Department
Admission: EM | Admit: 2022-03-28 | Discharge: 2022-03-28 | Discharge status: Against Medical Advice | Payer: Self-pay | Attending: Emergency Medicine | Admitting: Emergency Medicine

## 2022-03-28 DIAGNOSIS — Z1152 Encounter for screening for COVID-19: Secondary | ICD-10-CM | POA: Insufficient documentation

## 2022-03-28 DIAGNOSIS — Z5321 Procedure and treatment not carried out due to patient leaving prior to being seen by health care provider: Secondary | ICD-10-CM | POA: Insufficient documentation

## 2022-03-28 DIAGNOSIS — R509 Fever, unspecified: Secondary | ICD-10-CM | POA: Insufficient documentation

## 2022-03-28 DIAGNOSIS — R1084 Generalized abdominal pain: Secondary | ICD-10-CM | POA: Insufficient documentation

## 2022-03-28 LAB — CBC WITH DIFFERENTIAL/PLATELET
Abs Immature Granulocytes: 0.06 10*3/uL (ref 0.00–0.07)
Basophils Absolute: 0 10*3/uL (ref 0.0–0.1)
Basophils Relative: 0 %
Eosinophils Absolute: 0.1 10*3/uL (ref 0.0–0.5)
Eosinophils Relative: 0 %
HCT: 33 % — ABNORMAL LOW (ref 36.0–46.0)
Hemoglobin: 10.7 g/dL — ABNORMAL LOW (ref 12.0–15.0)
Immature Granulocytes: 0 %
Lymphocytes Relative: 9 %
Lymphs Abs: 1.5 10*3/uL (ref 0.7–4.0)
MCH: 22.7 pg — ABNORMAL LOW (ref 26.0–34.0)
MCHC: 32.4 g/dL (ref 30.0–36.0)
MCV: 69.9 fL — ABNORMAL LOW (ref 80.0–100.0)
Monocytes Absolute: 1.2 10*3/uL — ABNORMAL HIGH (ref 0.1–1.0)
Monocytes Relative: 7 %
Neutro Abs: 14.2 10*3/uL — ABNORMAL HIGH (ref 1.7–7.7)
Neutrophils Relative %: 84 %
Platelets: 249 10*3/uL (ref 150–400)
RBC: 4.72 MIL/uL (ref 3.87–5.11)
RDW: 15 % (ref 11.5–15.5)
WBC: 17 10*3/uL — ABNORMAL HIGH (ref 4.0–10.5)
nRBC: 0 % (ref 0.0–0.2)

## 2022-03-28 LAB — COMPREHENSIVE METABOLIC PANEL
ALT: 11 U/L (ref 0–44)
AST: 14 U/L — ABNORMAL LOW (ref 15–41)
Albumin: 4.2 g/dL (ref 3.5–5.0)
Alkaline Phosphatase: 36 U/L — ABNORMAL LOW (ref 38–126)
Anion gap: 8 (ref 5–15)
BUN: 8 mg/dL (ref 6–20)
CO2: 21 mmol/L — ABNORMAL LOW (ref 22–32)
Calcium: 9 mg/dL (ref 8.9–10.3)
Chloride: 106 mmol/L (ref 98–111)
Creatinine, Ser: 0.53 mg/dL (ref 0.44–1.00)
GFR, Estimated: >60 mL/min (ref >60)
Glucose, Bld: 112 mg/dL — ABNORMAL HIGH (ref 70–99)
Potassium: 3.4 mmol/L — ABNORMAL LOW (ref 3.5–5.1)
Sodium: 135 mmol/L (ref 135–145)
Total Bilirubin: 1 mg/dL (ref 0.3–1.2)
Total Protein: 8.2 g/dL — ABNORMAL HIGH (ref 6.5–8.1)

## 2022-03-28 LAB — URINALYSIS, ROUTINE W REFLEX MICROSCOPIC
Bilirubin Urine: NEGATIVE
Glucose, UA: NEGATIVE mg/dL
Ketones, ur: 20 mg/dL — AB
Nitrite: NEGATIVE
Protein, ur: 30 mg/dL — AB
Specific Gravity, Urine: 1.027 (ref 1.005–1.030)
pH: 5 (ref 5.0–8.0)

## 2022-03-28 LAB — RESP PANEL BY RT-PCR (FLU A&B, COVID) ARPGX2
Influenza A by PCR: NEGATIVE
Influenza B by PCR: NEGATIVE
SARS Coronavirus 2 by RT PCR: NEGATIVE

## 2022-03-28 LAB — POC URINE PREG, ED: Preg Test, Ur: NEGATIVE

## 2022-03-28 NOTE — ED Notes (Signed)
Pt noted leaving ED lobby with female visitor who st they are going elsewhere

## 2022-03-28 NOTE — ED Triage Notes (Signed)
Reports diffuse abd pain. Reports constant cramping with sharp pains intermittent. Reports body aches and subjective fever. Denies cough or congestion. Pt alert and oriented. Breathing unlabored speaking in full sentences. Symmetric chest rise and fall.

## 2022-03-28 NOTE — ED Notes (Signed)
Pt to STAT desk to inquire over wait time--updated on such; requesting pain med; pt informed that this nurse will go speak with a provider regarding such

## 2022-03-29 DIAGNOSIS — N7093 Salpingitis and oophoritis, unspecified: Secondary | ICD-10-CM

## 2022-03-29 HISTORY — DX: Salpingitis and oophoritis, unspecified: N70.93

## 2022-04-01 IMAGING — CR DG THORACIC SPINE 2V
3 series · 3 of 3 positions shown · non-contrast
Comparison: AP chest 06/03/2015

CLINICAL DATA: Back pain starting this morning.  No known trauma.

EXAM:
THORACIC SPINE 2 VIEWS

[t-spine ap]
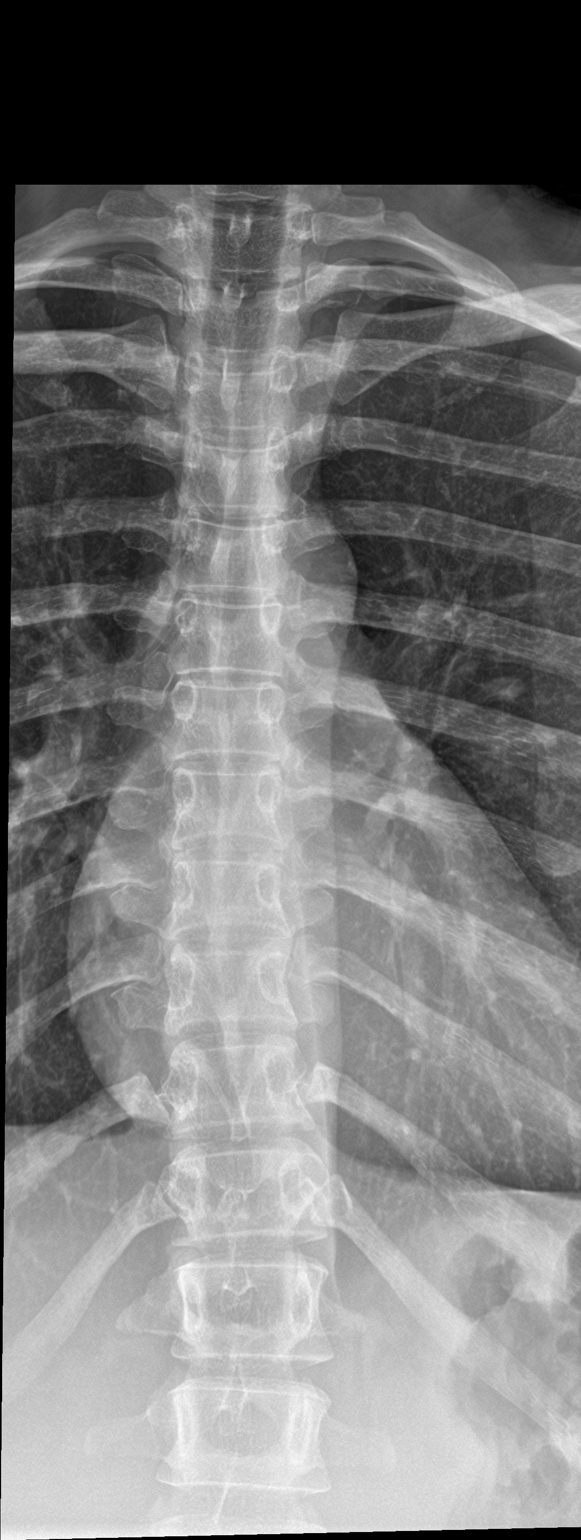

[t-spine lat]
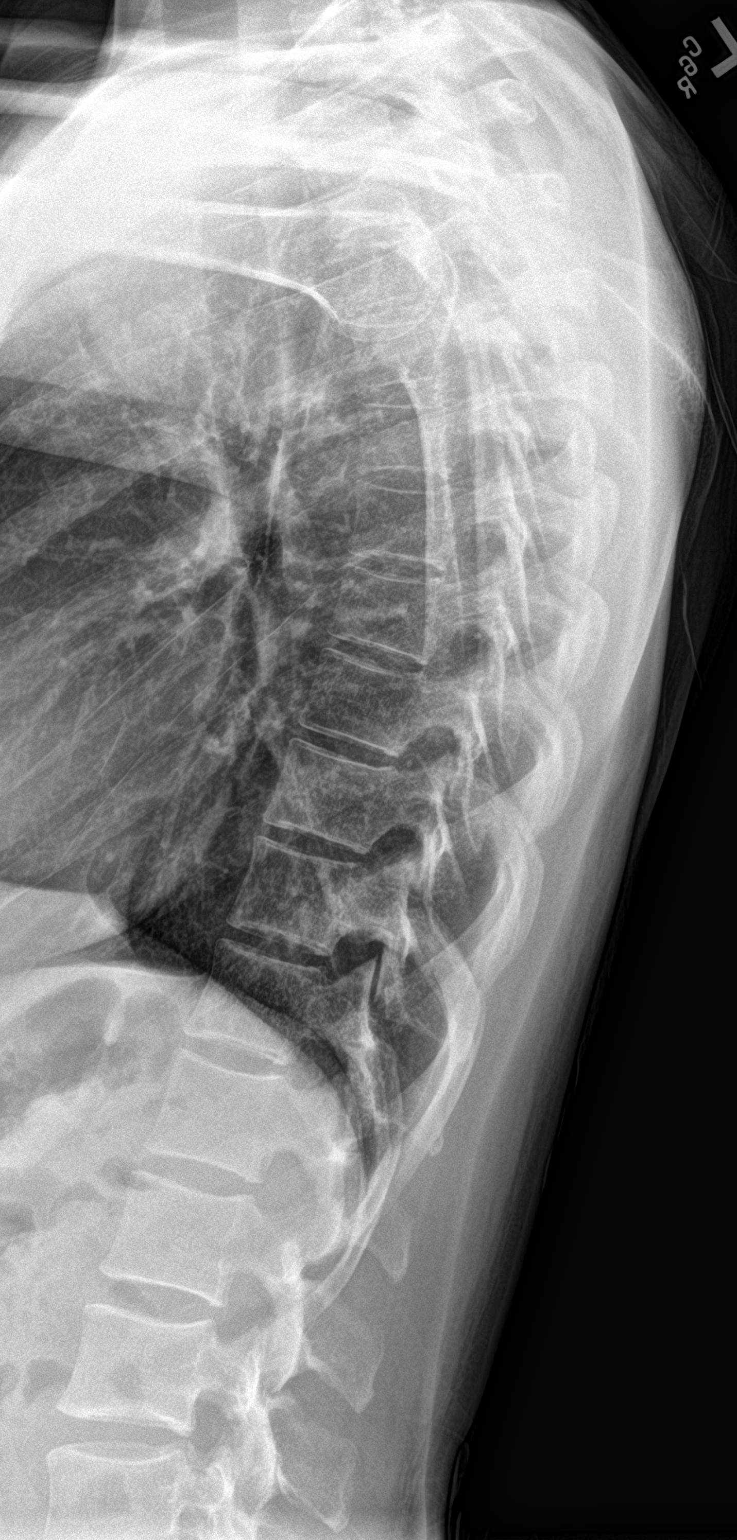

[t-spine swimmers]
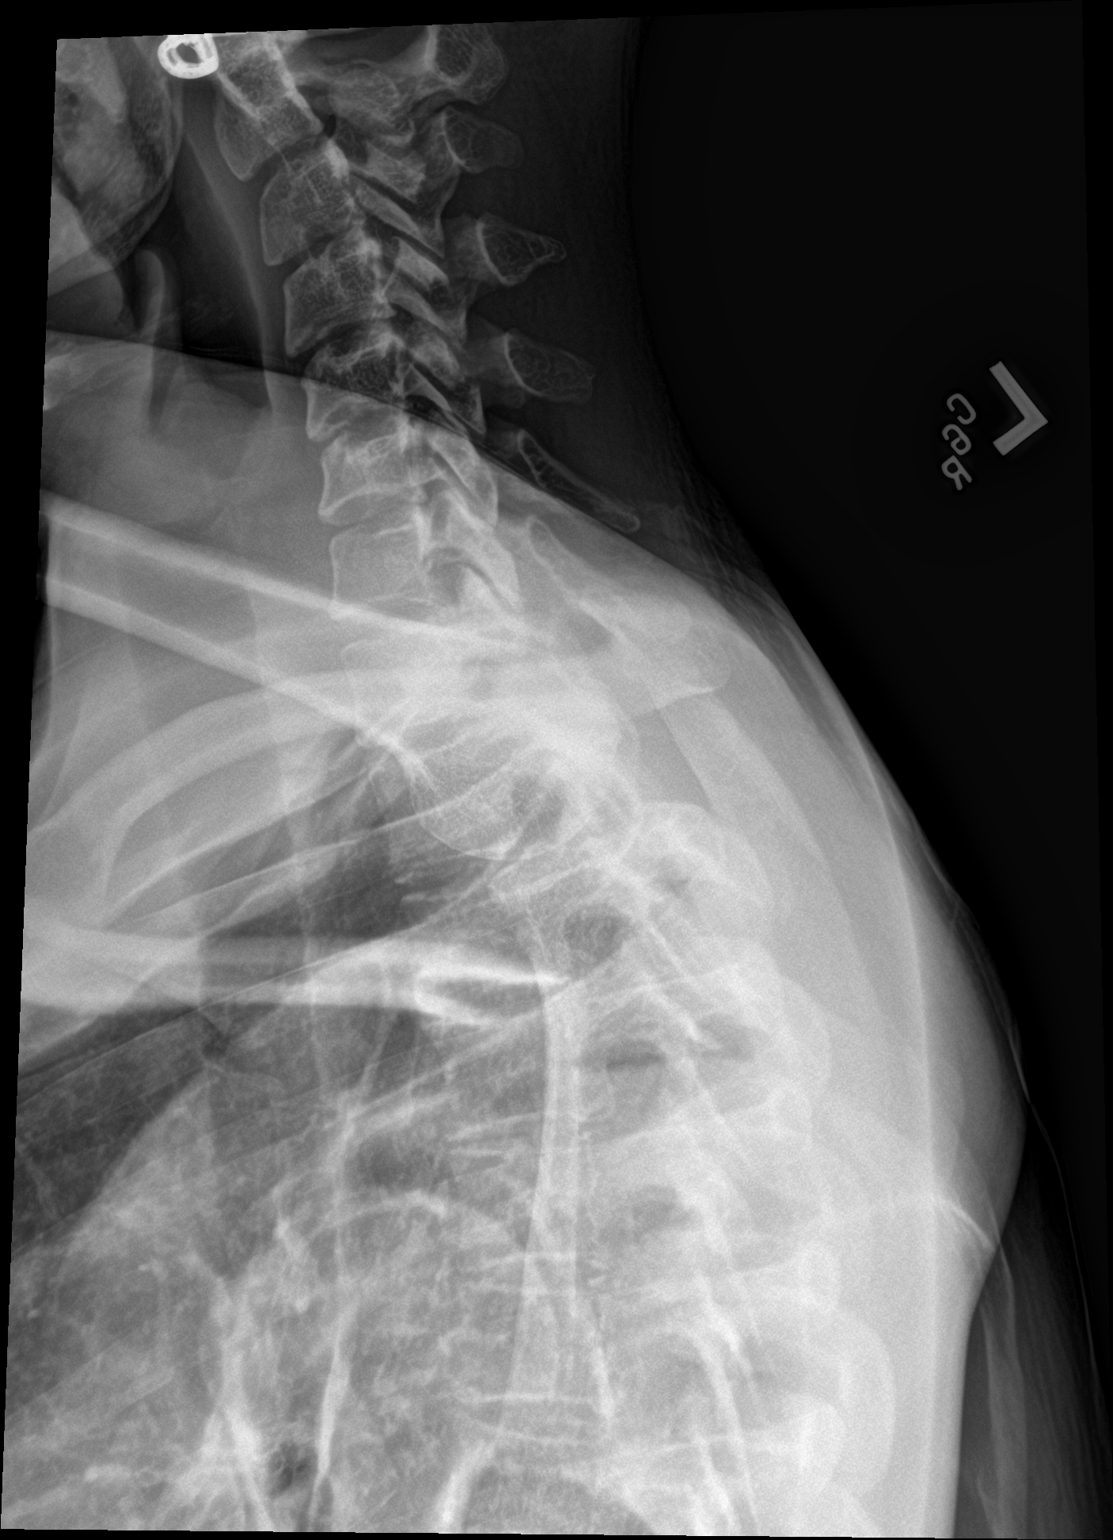

[3 of 3 positions shown; findings below may reference images not displayed]

FINDINGS: There are 12 rib-bearing thoracic type vertebral bodies. Trace
dextrocurvature centered at T6, similar to prior. Minimal right
T12-L1 disc space narrowing. Normal sagittal alignment. Vertebral
body heights are maintained.
IMPRESSION: Trace dextrocurvature centered at T6, unchanged.

Minimal right T12-L1 disc space narrowing.

## 2022-05-02 ENCOUNTER — Emergency Department
Admission: EM | Admit: 2022-05-02 | Discharge: 2022-05-02 | Disposition: A | Discharge status: Home / Self Care | Payer: Self-pay | Attending: Emergency Medicine | Admitting: Emergency Medicine

## 2022-05-02 ENCOUNTER — Other Ambulatory Visit: Payer: Self-pay

## 2022-05-02 ENCOUNTER — Encounter: Payer: Self-pay | Admitting: Emergency Medicine

## 2022-05-02 DIAGNOSIS — L0231 Cutaneous abscess of buttock: Secondary | ICD-10-CM | POA: Insufficient documentation

## 2022-05-02 MED ORDER — SULFAMETHOXAZOLE-TRIMETHOPRIM 800-160 MG PO TABS: 1.0000 | ORAL_TABLET | Freq: Two times a day (BID) | ORAL | 0 refills | Status: DC

## 2022-05-02 MED ORDER — OXYCODONE HCL 5 MG PO TABS
5.0000 mg | ORAL_TABLET | Freq: Once | ORAL | Status: AC
  Administered 2022-05-02: 5 mg via ORAL
  Filled 2022-05-02: qty 1

## 2022-05-02 MED ORDER — LIDOCAINE HCL (PF) 1 % IJ SOLN
5.0000 mL | Freq: Once | INTRAMUSCULAR | Status: AC
  Administered 2022-05-02: 5 mL
  Filled 2022-05-02: qty 5

## 2022-05-02 MED ORDER — OXYCODONE HCL 5 MG PO TABS: 5.0000 mg | ORAL_TABLET | Freq: Four times a day (QID) | ORAL | 0 refills | Status: DC | PRN

## 2022-05-02 MED ORDER — CEPHALEXIN 500 MG PO CAPS: 500.0000 mg | ORAL_CAPSULE | Freq: Three times a day (TID) | ORAL | 0 refills | Status: DC

## 2022-05-02 NOTE — ED Provider Notes (Signed)
[facility] Provider Note    Event Date/Time   First MD Initiated Contact with Patient 05/02/22 [phone]     (approximate)   History   Abscess   HPI  Tina Levy is a 36 y.o. female   presents to the ED with complaint of abscess.  Patient is scheduled at the end of January to have surgery with Dr. Tonna Boehringer for the same.  Patient has been using a heating pad to the area but states there has been no drainage.      Physical Exam   Triage Vital Signs: ED Triage Vitals  Enc Vitals Group     BP 05/02/22 0909 127/82     Pulse Rate 05/02/22 0909 90     Resp 05/02/22 0909 18     Temp 05/02/22 0909 98 F (36.7 C)     Temp Source 05/02/22 0909 Oral     SpO2 05/02/22 0909 100 %     Weight 05/02/22 0908 115 lb (52.2 kg)     Height 05/02/22 0908 5\' 1"  (1.549 m)     Head Circumference --      Peak Flow --      Pain Score 05/02/22 0908 10     Pain Loc --      Pain Edu? --      Excl. in GC? --     Most recent vital signs: Vitals:   05/02/22 0909 05/02/22 1229  BP: 127/82 120/80  Pulse: 90 88  Resp: 18 18  Temp: 98 F (36.7 C)   SpO2: 100% 99%     General: Awake, no distress.  CV:  Good peripheral perfusion.  Resp:  Normal effort.  Abd:  No distention.  Other:  Large abscess with fluctuant area in the center to the left upper buttocks.  No active drainage.  Markedly tender to light touch.   ED Results / Procedures / Treatments   Labs (all labs ordered are listed, but only abnormal results are displayed) Labs Reviewed - No data to display     PROCEDURES:  Critical Care performed:   12/23/23Marland KitchenIncision and Drainage  Date/Time: 05/02/2022 11:50 PM  Performed by: 05/04/2022, PA-C Authorized by: Tommi Rumps, PA-C   Consent:    Consent obtained:  Verbal   Consent given by:  Patient   Risks, benefits, and alternatives were discussed: yes     Risks discussed:  Incomplete drainage and infection Universal protocol:    Patient  identity confirmed:  Verbally with patient Location:    Type:  Abscess   Location:  Lower extremity   Lower extremity location:  Buttock   Buttock location:  L buttock Pre-procedure details:    Skin preparation:  Antiseptic wash Sedation:    Sedation type:  None Anesthesia:    Anesthesia method:  Local infiltration   Local anesthetic:  Lidocaine 1% w/o epi Procedure type:    Complexity:  Simple Procedure details:    Incision types:  Stab incision   Incision depth:  Dermal   Drainage:  Purulent   Drainage amount:  Scant   Wound treatment:  Wound left open Post-procedure details:    Procedure completion:  Procedure terminated at patient's request Comments:     Patient refused packing and continuation of the procedure due to inability to tolerate pain.  Patient was given oxycodone IR 5 mg 45 minutes prior to procedure and 1% lidocaine was used to infiltrate the area which patient tolerated poorly.  No packing  to this area as patient terminated the procedure.    MEDICATIONS ORDERED IN ED: Medications  oxyCODONE (Oxy IR/ROXICODONE) immediate release tablet 5 mg (5 mg Oral Given 05/02/22 1047)  lidocaine (PF) (XYLOCAINE) 1 % injection 5 mL (5 mLs Infiltration Given 05/02/22 1047)     IMPRESSION / MDM / ASSESSMENT AND PLAN / ED COURSE  I reviewed the triage vital signs and the nursing notes.   Differential diagnosis includes, but is not limited to, pilonidal cyst disease, abscess left buttocks.  36 year old female presents to the ED with abscess to her left buttocks.  Patient has a history of same and has seen Dr. Tonna Boehringer who plans surgery sometime in January.  Patient was unable to tolerate the procedure despite oral pain medication and lidocaine infiltration.  Patient terminated the procedure.  Patient is aware that there was not a drain placed to the area.  She agrees to pick up the antibiotics sent to the pharmacy, continue warm moist compresses to the area and voices  understanding that she cannot take the pain medication while driving or operating machinery.      Patient's presentation is most consistent with acute, uncomplicated illness.  FINAL CLINICAL IMPRESSION(S) / ED DIAGNOSES   Final diagnoses:  Abscess of left buttock     Rx / DC Orders   ED Discharge Orders          Ordered    sulfamethoxazole-trimethoprim (BACTRIM DS) 800-160 MG tablet  2 times daily        05/02/22 1219    cephALEXin (KEFLEX) 500 MG capsule  3 times daily        05/02/22 1219    oxyCODONE (OXY IR/ROXICODONE) 5 MG immediate release tablet  Every 6 hours PRN        05/02/22 1219             Note:  This document was prepared using Dragon voice recognition software and may include unintentional dictation errors.   Tommi Rumps, PA-C 05/02/22 1232    Pilar Jarvis, MD 05/03/22 [phone]

## 2022-05-02 NOTE — Discharge Instructions (Signed)
Call your surgeon to schedule for surgery in January. Take antibiotics until completely finished.  A prescription for pain medication was also sent.  Be aware that you cannot drive or operate machinery while taking the pain medication as it could cause drowsiness.  Continue using warm moist compresses or actually sit in a tub of warm water to help with drainage.

## 2022-05-02 NOTE — ED Notes (Signed)
Pt states cyst/abscess to the buttocks that she is supposed to have surgery for in a month, but the pain is "30 out of 10." Tailbone appears to have an abscess, shiny in appearance with a white section, which the pt states has been popping. Pt noted to move/walk, but slowly.

## 2022-05-02 NOTE — ED Triage Notes (Signed)
Patient arrives ambulatory by POV c/o pilonidal abscess since Sunday. States she is supposed to have surgery for it next month.

## 2022-05-31 ENCOUNTER — Encounter
Admission: RE | Admit: 2022-05-31 | Discharge: 2022-05-31 | Disposition: A | Discharge status: Home / Self Care | Payer: BC Managed Care – PPO | Source: Ambulatory Visit | Attending: Surgery | Admitting: Surgery

## 2022-05-31 VITALS — Ht 61.0 in | Wt 105.0 lb

## 2022-05-31 DIAGNOSIS — Z01812 Encounter for preprocedural laboratory examination: Secondary | ICD-10-CM

## 2022-05-31 NOTE — Patient Instructions (Addendum)
Your procedure is scheduled on: Thursday, January 25 Report to the Registration Desk on the 1st floor of the [facility]. To find out your arrival time, please call [phone] between 1PM - 3PM on: Wednesday, January 24 If your arrival time is 6:00 am, do not arrive prior to that time as the [facility] entrance doors do not open until 6:00 am.  REMEMBER: Instructions that are not followed completely may result in serious medical risk, up to and including death; or upon the discretion of your surgeon and anesthesiologist your surgery may need to be rescheduled.  Do not eat food after midnight the night before surgery.  No gum chewing, lozengers or hard candies.  You may however, drink CLEAR liquids up to 2 hours before you are scheduled to arrive for your surgery. Do not drink anything within 2 hours of your scheduled arrival time.  Clear liquids include: - water  - apple juice without pulp - gatorade (not RED colors) - black coffee or tea (Do NOT add milk or creamers to the coffee or tea) Do NOT drink anything that is not on this list.  DO NOT TAKE ANY MEDICATIONS THE MORNING OF SURGERY  One week prior to surgery: starting today, JANUARY 19 Stop Anti-inflammatories (NSAIDS) such as Advil, Aleve, Ibuprofen, Motrin, Naproxen, Naprosyn and Aspirin based products such as Excedrin, Goodys Powder, BC Powder. Stop ANY OVER THE COUNTER supplements until after surgery. You may however, continue to take Tylenol if needed for pain up until the day of surgery.  No Alcohol for 24 hours before or after surgery.  No Smoking including e-cigarettes for 24 hours prior to surgery.  No chewable tobacco products for at least 6 hours prior to surgery.  No nicotine patches on the day of surgery.  Do not use any "recreational" drugs for at least a week prior to your surgery.  Please be advised that the combination of cocaine and anesthesia may have negative outcomes, up to and including death. If  you test positive for cocaine, your surgery will be cancelled.  On the morning of surgery brush your teeth with toothpaste and water, you may rinse your mouth with mouthwash if you wish. Do not swallow any toothpaste or mouthwash.  Shower using antibacterial soap prior to arriving at the hospital on the day of surgery.  Do not wear jewelry, make-up, hairpins, clips or nail polish.  Do not wear lotions, powders, or perfumes.   Do not shave body from the neck down 48 hours prior to surgery just in case you cut yourself which could leave a site for infection.   Contact lenses, hearing aids and dentures may not be worn into surgery.  Do not bring valuables to the hospital. [facility] is not responsible for any missing/lost belongings or valuables.   Notify your doctor if there is any change in your medical condition (cold, fever, infection).  Wear comfortable clothing (specific to your surgery type) to the hospital.  After surgery, you can help prevent lung complications by doing breathing exercises.  Take deep breaths and cough every 1-2 hours. Your doctor may order a device called an Incentive Spirometer to help you take deep breaths.  If you are being discharged the day of surgery, you will not be allowed to drive home. You will need a responsible adult (18 years or older) to drive you home and stay with you that night.   If you are taking public transportation, you will need to have a responsible adult (  18 years or older) with you. Please confirm with your physician that it is acceptable to use public transportation.   Please call the [facility] at [phone] if you have any questions about these instructions.  Surgery Visitation Policy:  Patients undergoing a surgery or procedure may have two family members or support persons with them as long as the person is not COVID-19 positive or experiencing its symptoms.

## 2022-06-06 ENCOUNTER — Encounter: Admission: RE | Payer: Self-pay | Source: Home / Self Care

## 2022-06-06 ENCOUNTER — Ambulatory Visit: Admission: RE | Admit: 2022-06-06 | Payer: Self-pay | Source: Home / Self Care | Admitting: Surgery

## 2022-06-06 SURGERY — EXCISION, PILONIDAL CYST, EXTENSIVE
Anesthesia: General | Site: Buttocks

## 2023-01-28 ENCOUNTER — Encounter: Payer: Self-pay | Admitting: Emergency Medicine

## 2023-01-28 ENCOUNTER — Emergency Department
Admission: EM | Admit: 2023-01-28 | Discharge: 2023-01-28 | Disposition: A | Discharge status: Home / Self Care | Payer: BC Managed Care – PPO | Attending: Emergency Medicine | Admitting: Emergency Medicine

## 2023-01-28 ENCOUNTER — Other Ambulatory Visit: Payer: Self-pay

## 2023-01-28 DIAGNOSIS — R0602 Shortness of breath: Secondary | ICD-10-CM | POA: Diagnosis present

## 2023-01-28 DIAGNOSIS — J4521 Mild intermittent asthma with (acute) exacerbation: Secondary | ICD-10-CM | POA: Diagnosis not present

## 2023-01-28 MED ORDER — PREDNISONE 10 MG (21) PO TBPK: ORAL_TABLET | ORAL | 0 refills | Status: DC

## 2023-01-28 MED ORDER — LORATADINE 10 MG PO TABS: 10.0000 mg | ORAL_TABLET | Freq: Every day | ORAL | 0 refills | Status: DC

## 2023-01-28 NOTE — ED Provider Notes (Signed)
[facility] Provider Note   Event Date/Time   First MD Initiated Contact with Patient 01/28/23 1610     (approximate) History  Asthma  HPI Tina Levy is a 37 y.o. female with a stated past medical history of asthma who presents for shortness of breath that has been intermittent over the last 2 days.  Patient states that she received an albuterol inhaler from urgent care that has only moderately improved her symptoms.  Patient states that she was getting her hair done today when she had another acute episode of shortness of breath.  Patient denies any diagnosis of anxiety.  Patient does endorse left-sided chest pain with the shortness of breath as well ROS: Patient currently denies any vision changes, tinnitus, difficulty speaking, facial droop, sore throat, abdominal pain, nausea/vomiting/diarrhea, dysuria, or weakness/numbness/paresthesias in any extremity   Physical Exam  Triage Vital Signs: ED Triage Vitals [01/28/23 1539]  Encounter Vitals Group     BP 124/78     Systolic BP Percentile      Diastolic BP Percentile      Pulse Rate 92     Resp 18     Temp 98.6 F (37 C)     Temp Source Oral     SpO2 99 %     Weight 115 lb (52.2 kg)     Height 5\' 1"  (1.549 m)     Head Circumference      Peak Flow      Pain Score 10     Pain Loc      Pain Education      Exclude from Growth Chart    Most recent vital signs: Vitals:   01/28/23 1539  BP: 124/78  Pulse: 92  Resp: 18  Temp: 98.6 F (37 C)  SpO2: 99%   General: Awake, oriented x4. CV:  Good peripheral perfusion.  Resp:  Normal effort.  Clear to auscultation bilaterally Abd:  No distention.  Other:  Middle-aged well-developed, well-nourished, African-American female resting comfortably in no acute distress ED Results / Procedures / Treatments  Labs (all labs ordered are listed, but only abnormal results are displayed) Labs Reviewed - No data to display  PROCEDURES: Critical Care  performed: No Procedures MEDICATIONS ORDERED IN ED: Medications - No data to display IMPRESSION / MDM / ASSESSMENT AND PLAN / ED COURSE  I reviewed the triage vital signs and the nursing notes.                             The patient is on the cardiac monitor to evaluate for evidence of arrhythmia and/or significant heart rate changes. Patient's presentation is most consistent with acute presentation with potential threat to life or bodily function. The patient is suffering from shortness of breath, but the immediate cause is not apparent.  Potential causes considered include, but are not limited to, asthma or COPD, congestive heart failure, pulmonary embolism, pneumothorax, coronary syndrome, pneumonia, and pleural effusion.  Despite the evaluation including history, exam, and testing, the cause of the shortness of breath remains unclear. However, during the ED stay, patient's condition improved, and at the time of discharge the shortness of breath is resolved, they are feeling well, and want to go home.  Patient will be discharged with strict return precautions and advice to follow up with primary MD within 24 hours for further evaluation.   FINAL CLINICAL IMPRESSION(S) / ED DIAGNOSES   Final diagnoses:  Shortness of breath  Mild intermittent asthma with exacerbation   Rx / DC Orders   ED Discharge Orders          Ordered    predniSONE (STERAPRED UNI-PAK 21 TAB) 10 MG (21) TBPK tablet        01/28/23 1618    loratadine (CLARITIN) 10 MG tablet  Daily        01/28/23 1618           Note:  This document was prepared using Dragon voice recognition software and may include unintentional dictation errors.   Merwyn Katos, MD 01/28/23 [phone]

## 2023-01-28 NOTE — ED Provider Triage Note (Signed)
Emergency Medicine Provider Triage Evaluation Note  Tina Levy , a 37 y.o. female  was evaluated in triage.  Pt complains of asthma exacerbation that began on Saturday. She went to [facility] and had treatment for this. She states that today it flared back up again. She last used her inhaler about an hour ago. Patient was using her inhaler every 20 minutes after it flared today.  Review of Systems  Positive: Headache, SOB, chest tightness, cough Negative: fever  Physical Exam  There were no vitals taken for this visit. Gen:   Awake, no distress   Resp:  Normal effort, rhonchi in lower lobes bilaterally MSK:   Moves extremities without difficulty  Medical Decision Making  Medically screening exam initiated at 3:38 PM.  Appropriate orders placed.  Tina Levy was informed that the remainder of the evaluation will be completed by another provider, this initial triage assessment does not replace that evaluation, and the importance of remaining in the ED until their evaluation is complete.    Cameron Ali, PA-C 01/28/23 1543

## 2023-01-28 NOTE — ED Triage Notes (Signed)
Patient to ED via POV for asthma attack. States started Saturday- went to [facility] and got a treatment. States had flair up today while getting hair done. Last used inhaler approx 1 hr ago. Speaking in full sentences without difficulties. Also has 10/10 headache.

## 2023-01-28 NOTE — ED Notes (Signed)
See triage note  Presents with some cough with chest discomfort  And wheezing  States she has been using her inhaler w/o  relief  No fever

## 2023-03-31 ENCOUNTER — Ambulatory Visit: Payer: Self-pay | Admitting: Surgery

## 2023-03-31 NOTE — H&P (Signed)
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 female who returns for evaluation of above. Seen last year, no major change in symptoms. Now ready to schedule surgery   Past Medical History:  has no past medical history on file.  Past Surgical History:  has no past surgical history on file.  Family History: family history is not on file.  Social History:  reports that she has been smoking cigarettes. She has never used smokeless tobacco. She reports current alcohol use. She reports that she does not use drugs.  Current Medications: has a current medication list which includes the following prescription(s): albuterol mdi (proventil, ventolin, proair) hfa, loratadine, and multivitamin with minerals.  Allergies:  Allergies  Allergen Reactions   Others Other (See Comments)    Uncoded Allergy. Allergen: seafood, Other Reaction: Other reaction   Peanut Other (See Comments) and Anaphylaxis    Other Reaction: Other reaction   Peanut Oil Anaphylaxis   Shellfish Containing Products Anaphylaxis    ROS:  A 15 point review of systems was performed and pertinent positives and negatives noted in HPI   Objective:     BP 121/77   Pulse 89   Ht 154.9 cm (5\' 1" )   Wt 53.4 kg (117 lb 12.8 oz)   LMP 03/27/2023   BMI 22.26 kg/m   Constitutional :  No distress, cooperative, alert  Lymphatics/Throat:  Supple with no lymphadenopathy  Respiratory:  Clear to auscultation bilaterally  Cardiovascular:  Regular rate and rhythm  Gastrointestinal: Soft, non-tender, non-distended, no organomegaly.  Musculoskeletal: Steady gait and movement  Skin: Cool and moist. Chaperone present for exam.  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  Alternatives  include continued observation.  Benefits include possible symptom relief, pathologic evaluation, improved cosmesis. Discussed the risk of surgery including recurrence, chronic pain, post-op infxn, poor cosmesis, poor/delayed wound healing, and possible re-operation to address said risks. The risks of general anesthetic, if used, includes MI, CVA, sudden death or even reaction to anesthetic medications also discussed.  Typical post-op recovery time of 3-5 days with possible activity restrictions were also discussed.  The patient verbalized understanding and all questions were answered to the patient's satisfaction.  2. Patient has elected to proceed with surgical treatment. Procedure will be scheduled. Excision with bascom flap.  labs/images/medications/previous chart entries reviewed personally and relevant changes/updates noted above.

## 2023-05-18 ENCOUNTER — Encounter: Payer: Self-pay | Admitting: Urgent Care

## 2023-06-05 ENCOUNTER — Inpatient Hospital Stay: Admission: RE | Admit: 2023-06-05 | Payer: BC Managed Care – PPO | Source: Ambulatory Visit

## 2023-06-05 NOTE — Progress Notes (Signed)
Phone interview not completed; call x 2 left messages. Will reschedule for Monday interview and try again.

## 2023-06-09 ENCOUNTER — Inpatient Hospital Stay: Admission: RE | Admit: 2023-06-09 | Payer: BC Managed Care – PPO | Source: Ambulatory Visit

## 2023-06-12 ENCOUNTER — Ambulatory Visit: Admission: RE | Admit: 2023-06-12 | Payer: BC Managed Care – PPO | Source: Home / Self Care | Admitting: Surgery

## 2023-06-12 ENCOUNTER — Encounter: Admission: RE | Payer: Self-pay | Source: Home / Self Care

## 2023-06-12 DIAGNOSIS — Z01812 Encounter for preprocedural laboratory examination: Secondary | ICD-10-CM

## 2023-06-12 SURGERY — CYST EXCISION PILONIDAL EXTENSIVE
Anesthesia: General | Site: Buttocks

## 2023-09-24 ENCOUNTER — Emergency Department
Admission: EM | Admit: 2023-09-24 | Discharge: 2023-09-24 | Disposition: A | Discharge status: Home / Self Care | Attending: Emergency Medicine | Admitting: Emergency Medicine

## 2023-09-24 ENCOUNTER — Other Ambulatory Visit: Payer: Self-pay

## 2023-09-24 ENCOUNTER — Emergency Department

## 2023-09-24 DIAGNOSIS — R0602 Shortness of breath: Secondary | ICD-10-CM

## 2023-09-24 DIAGNOSIS — J4521 Mild intermittent asthma with (acute) exacerbation: Secondary | ICD-10-CM | POA: Insufficient documentation

## 2023-09-24 MED ORDER — PREDNISONE 50 MG PO TABS: 50.0000 mg | ORAL_TABLET | Freq: Every day | ORAL | 0 refills | Status: AC

## 2023-09-24 MED ORDER — PREDNISONE 20 MG PO TABS
60.0000 mg | ORAL_TABLET | Freq: Once | ORAL | Status: AC
  Administered 2023-09-24: 60 mg via ORAL
  Filled 2023-09-24: qty 3

## 2023-09-24 MED ORDER — IPRATROPIUM-ALBUTEROL 0.5-2.5 (3) MG/3ML IN SOLN
6.0000 mL | Freq: Once | RESPIRATORY_TRACT | Status: AC
  Administered 2023-09-24: 6 mL via RESPIRATORY_TRACT
  Filled 2023-09-24: qty 6

## 2023-09-24 MED ORDER — ALBUTEROL SULFATE HFA 108 (90 BASE) MCG/ACT IN AERS: 1.0000 | INHALATION_SPRAY | Freq: Four times a day (QID) | RESPIRATORY_TRACT | 0 refills | Status: AC | PRN

## 2023-09-24 NOTE — ED Provider Notes (Signed)
 [facility] Provider Note    Event Date/Time   First MD Initiated Contact with Patient 09/24/23 2008     (approximate)   History   Shortness of Breath   HPI  Tina Levy is a 38 y.o. female past medical history significant for asthma who presents to the emergency department with shortness of breath.  States that she started feeling shortness of breath today.'s complaining of a cough and feels like she is having an exacerbation of her asthma.  Does state that she continues to smoke.  Tried her rescue inhaler without significant improvement.  Denies any chest pain.  No history of DVT or PE.  No recent travel.  Not on OCP.     Physical Exam   Triage Vital Signs: ED Triage Vitals  Encounter Vitals Group     BP 09/24/23 1950 (!) 142/98     Systolic BP Percentile --      Diastolic BP Percentile --      Pulse Rate 09/24/23 1950 70     Resp 09/24/23 1950 20     Temp 09/24/23 1950 98.1 F (36.7 C)     Temp Source 09/24/23 1950 Oral     SpO2 09/24/23 1950 99 %     Weight 09/24/23 1951 120 lb (54.4 kg)     Height 09/24/23 1951 5\' 1"  (1.549 m)     Head Circumference --      Peak Flow --      Pain Score 09/24/23 1951 10     Pain Loc --      Pain Education --      Exclude from Growth Chart --     Most recent vital signs: Vitals:   09/24/23 1950 09/24/23 2247  BP: (!) 142/98 (!) 144/92  Pulse: 70 74  Resp: 20 20  Temp: 98.1 F (36.7 C) 98.3 F (36.8 C)  SpO2: 99% 98%    Physical Exam Constitutional:      Appearance: She is well-developed.  HENT:     Head: Atraumatic.  Eyes:     Conjunctiva/sclera: Conjunctivae normal.  Cardiovascular:     Rate and Rhythm: Regular rhythm.  Pulmonary:     Effort: No respiratory distress.     Breath sounds: Wheezing (Mild and expiratory wheeze) present.  Abdominal:     General: There is no distension.  Musculoskeletal:        General: Normal range of motion.     Cervical back: Normal range of motion.   Skin:    General: Skin is warm.  Neurological:     Mental Status: She is alert. Mental status is at baseline.     IMPRESSION / MDM / ASSESSMENT AND PLAN / ED COURSE  I reviewed the triage vital signs and the nursing notes.  Differential diagnosis including pneumonia, asthma exacerbation, viral illness including COVID/influenza.  Low suspicion for pulmonary embolism, low risk Wells criteria and PERC negative.  Low suspicion for ACS, no chest pain at this time.  Clinical picture is not consistent with a dissection, no tearing chest pain equal and symmetric pulses. RADIOLOGY I independently reviewed imaging, my interpretation of imaging: Chest x-ray with no signs of pneumonia  LABS (all labs ordered are listed, but only abnormal results are displayed) Labs interpreted as -    Labs Reviewed - No data to display   MDM  Given DuoNeb treatment and steroids.  On reevaluation states she feeling much better.  Continues to maintain an oxygen saturation of  99%.  No signs of respiratory distress.  Will refill her inhaler, discussed smoking cessation and will start a steroid.  Discussed return precautions for any ongoing or worsening symptoms.     PROCEDURES:  Critical Care performed: No  Procedures  Patient's presentation is most consistent with acute presentation with potential threat to life or bodily function.   MEDICATIONS ORDERED IN ED: Medications  ipratropium-albuterol (DUONEB) 0.5-2.5 (3) MG/3ML nebulizer solution 6 mL (6 mLs Nebulization Given 09/24/23 2043)  predniSONE  (DELTASONE ) tablet 60 mg (60 mg Oral Given 09/24/23 2042)    FINAL CLINICAL IMPRESSION(S) / ED DIAGNOSES   Final diagnoses:  SOB (shortness of breath)  Mild intermittent asthma with exacerbation     Rx / DC Orders   ED Discharge Orders          Ordered    albuterol (VENTOLIN HFA) 108 (90 Base) MCG/ACT inhaler  Every 6 hours PRN        09/24/23 2033    predniSONE  (DELTASONE ) 50 MG tablet  Daily  with breakfast        09/24/23 2034             Note:  This document was prepared using Dragon voice recognition software and may include unintentional dictation errors.   Viviano Ground, MD 09/25/23 [phone]

## 2023-09-24 NOTE — Discharge Instructions (Addendum)
 You are given a scription for albuterol and a steroid.  Follow-up closely with your primary care physician.  Try to work on stop smoking.  Return to the emergency department for any worsening symptoms.  Prednisone  -you are given a prescription for a steroid.  It is important that you take this medication with food.  This medication can cause an upset stomach.  It also can increase your glucose if you have a history of diabetes, so it is important that you check your glucose frequently while you are on this medication.

## 2023-09-24 NOTE — ED Triage Notes (Signed)
 Pt presents via POV c/o asthma symptoms. Reports SOB not improved using rescue inhaler.

## 2023-09-24 NOTE — ED Notes (Signed)
 Patient reports that she is breathing better following completion of breathing treatment.
